# Patient Record
Sex: Male | Born: 1960
Health system: Southern US, Community
[De-identification: ages and names within clinical notes are randomized; demographics above are authoritative.]

## PROBLEM LIST (undated history)

## (undated) DIAGNOSIS — C4491 Basal cell carcinoma of skin, unspecified: Secondary | ICD-10-CM

## (undated) DIAGNOSIS — M109 Gout, unspecified: Secondary | ICD-10-CM

## (undated) DIAGNOSIS — N200 Calculus of kidney: Secondary | ICD-10-CM

## (undated) DIAGNOSIS — E119 Type 2 diabetes mellitus without complications: Secondary | ICD-10-CM

## (undated) DIAGNOSIS — E291 Testicular hypofunction: Secondary | ICD-10-CM

## (undated) DIAGNOSIS — K760 Fatty (change of) liver, not elsewhere classified: Secondary | ICD-10-CM

## (undated) DIAGNOSIS — E785 Hyperlipidemia, unspecified: Secondary | ICD-10-CM

## (undated) DIAGNOSIS — I10 Essential (primary) hypertension: Secondary | ICD-10-CM

## (undated) DIAGNOSIS — K5790 Diverticulosis of intestine, part unspecified, without perforation or abscess without bleeding: Secondary | ICD-10-CM

## (undated) DIAGNOSIS — K648 Other hemorrhoids: Secondary | ICD-10-CM

## (undated) DIAGNOSIS — J301 Allergic rhinitis due to pollen: Secondary | ICD-10-CM

## (undated) DIAGNOSIS — F1010 Alcohol abuse, uncomplicated: Secondary | ICD-10-CM

## (undated) HISTORY — DX: Type 2 diabetes mellitus without complications: E11.9

## (undated) HISTORY — DX: Other hemorrhoids: K64.8

## (undated) HISTORY — DX: Allergic rhinitis due to pollen: J30.1

## (undated) HISTORY — DX: Fatty (change of) liver, not elsewhere classified: K76.0

## (undated) HISTORY — DX: Basal cell carcinoma of skin, unspecified: C44.91

## (undated) HISTORY — DX: Testicular hypofunction: E29.1

## (undated) HISTORY — DX: Essential (primary) hypertension: I10

## (undated) HISTORY — PX: VASECTOMY: SHX75

## (undated) HISTORY — DX: Calculus of kidney: N20.0

## (undated) HISTORY — DX: Alcohol abuse, uncomplicated: F10.10

## (undated) HISTORY — DX: Diverticulosis of intestine, part unspecified, without perforation or abscess without bleeding: K57.90

## (undated) HISTORY — DX: Gout, unspecified: M10.9

## (undated) HISTORY — DX: Hyperlipidemia, unspecified: E78.5

---

## 2010-08-17 ENCOUNTER — Ambulatory Visit: Payer: Self-pay | Admitting: Dietician

## 2010-09-11 ENCOUNTER — Ambulatory Visit: Payer: Self-pay | Admitting: Dietician

## 2010-10-03 ENCOUNTER — Encounter: Payer: BC Managed Care – PPO | Attending: Family Medicine | Admitting: Dietician

## 2010-10-03 DIAGNOSIS — E119 Type 2 diabetes mellitus without complications: Secondary | ICD-10-CM | POA: Insufficient documentation

## 2010-10-03 DIAGNOSIS — Z713 Dietary counseling and surveillance: Secondary | ICD-10-CM | POA: Insufficient documentation

## 2010-10-15 ENCOUNTER — Encounter: Payer: Self-pay | Admitting: Dietician

## 2010-10-15 NOTE — Progress Notes (Signed)
  Medical Nutrition Therapy:  Appt start time: 0830 end time:  0930.   Assessment:  Primary concerns today: Diabetes self-management couseling.   MEDICATIONS: review of medications reveals he is no longer taking the insulin.  His fasting blood glucose levels are 97-113.   DIETARY INTAKE:  Usual eating pattern includes 3 meals and 2 snacks per day. 24-hr recall:  B (7-8:00 AM): Cheerios, 1 cup with a banana or blue berries and skim milk; or eggs with whole wheat toast and bacon. Snk ( AM): none  L ( PM): 12:00-1:00 Salad with lettuce, tomato carrots, black olives +/- cheese or sprouts and 1/2 of Hass avocado with low fat ranch dressing and +/- [place]. Snk ( PM): Special K Bar D ( PM): 6 inch flat bread sandwich from [employer] with grill chicken, cheese, avocado, and  Peppers. Snk ( PM): Usually has ice cream, has decreased his serving to 120 calories and adds a handful of almonds.  Usual physical activity: Walking 3 miles 5 times per week.  Estimated energy needs: 1800-1900 calories 200-205 g carbohydrates 135-140 g protein 50-52 g fat  Progress Towards Goal(s):  In progress.   Nutritional Diagnosis:  -2.1 Inpaired nutrition utilization As related to glucose.  As evidenced by diagnosis of type 2 diabetes and previously elevated blood glucose levesl.    Intervention:  Nutrition review of carbohydrate portions and exchanges and limiting his intake to 3-4 servings or 45-60 gm for meals and 15 gm of carbohydrate and protein for each snack.  Handouts given during visit include:  Novo Nordisk Carbohydrate Counting Book  Menu suggestions for 45 and 60 gram carbohydrate diets.  Snack list  Monitoring/Evaluation:  Dietary intake, exercise, blood glucose levels, and body weight in 12 weeks, to call with questions in the meantime or e-mail.  To call for follow-up visit.

## 2013-02-09 ENCOUNTER — Encounter: Payer: Self-pay | Admitting: Family Medicine

## 2013-02-09 ENCOUNTER — Ambulatory Visit (INDEPENDENT_AMBULATORY_CARE_PROVIDER_SITE_OTHER): Payer: BC Managed Care – PPO | Admitting: Family Medicine

## 2013-02-09 VITALS — BP 140/90 | HR 92 | Temp 97.4°F | Ht 64.25 in | Wt 214.2 lb

## 2013-02-09 DIAGNOSIS — C4491 Basal cell carcinoma of skin, unspecified: Secondary | ICD-10-CM

## 2013-02-09 DIAGNOSIS — I10 Essential (primary) hypertension: Secondary | ICD-10-CM

## 2013-02-09 DIAGNOSIS — J301 Allergic rhinitis due to pollen: Secondary | ICD-10-CM

## 2013-02-09 DIAGNOSIS — N200 Calculus of kidney: Secondary | ICD-10-CM

## 2013-02-09 DIAGNOSIS — E119 Type 2 diabetes mellitus without complications: Secondary | ICD-10-CM

## 2013-02-09 DIAGNOSIS — E291 Testicular hypofunction: Secondary | ICD-10-CM

## 2013-02-09 DIAGNOSIS — M109 Gout, unspecified: Secondary | ICD-10-CM

## 2013-02-09 MED ORDER — TESTOSTERONE 20.25 MG/1.25GM (1.62%) TD GEL: TRANSDERMAL | Status: DC

## 2013-02-09 NOTE — Progress Notes (Signed)
Date:  02/09/2013   Name:  Allen Howe   DOB:  10/06/1960   MRN:  [id number] Gender: male Age: 52 y.o.  Primary Physician:  Hannah Beat, MD   Chief Complaint: New Patient   Subjective:   History of Present Illness:  Allen Howe is a 52 y.o. pleasant patient who presents with the following:  Patient presents as a new patient. He is a former patient of Dr. Prince Rome.  He has type 2 diabetes mellitus, which is well controlled.  He also has hypertension, which is also well controlled, and he is very compliant.  He also does have intermittent gout, but he is taking some allopurinol as well as some intermittent colchicine and is stable.  He also has ALLERGIC rhinitis, but this is also stable when he takes all of his ALLERGY medication.  Hypergonadism, he has intermittently been on some different testosterone shots receiving them anywhere from every 2-4 weeks. He is unable to give his shots himself, and he previously would get them in either the pharmacy or at his primary care provider office. He recently weaned himself off of testosterone, and feels very poorly. He is interested in doing one of the topical medications and avoiding intramuscular injection, and he has a fear of needles.  Patient Active Problem List   Diagnosis Date Noted  . Hypertension   . Kidney stones   . Diabetes type 2, controlled   . Basal cell carcinoma   . Gout   . Allergic rhinitis due to pollen   . Hypogonadism male     Past Medical History  Diagnosis Date  . Diabetes type 2, controlled   . Basal cell carcinoma     Basal Cell on neck  . Kidney stones   . Hypertension   . Gout   . Allergic rhinitis due to pollen   . Hypogonadism male     No past surgical history on file.  History   Social History  . Marital Status: Married    Spouse Name: N/A    Number of Children: N/A  . Years of Education: N/A   Occupational History  . Not on file.   Social History Main Topics  . Smoking  status: Never Smoker   . Smokeless tobacco: Former Neurosurgeon    Types: Chew    Quit date: 10/14/2005  . Alcohol Use: 7.0 oz/week    14 drink(s) per week  . Drug Use: No  . Sexual Activity: Not on file   Other Topics Concern  . Not on file   Social History Narrative  . No narrative on file    Family History  Problem Relation Age of Onset  . Hypertension Mother   . Heart disease Father   . Hypertension Father   . Diabetes Father     No Known Allergies  Medication list has been reviewed and updated.  Review of Systems:   GEN: No acute illnesses, no fevers, chills. GI: No n/v/d, eating normally Pulm: No SOB Interactive and getting along well at home.  Otherwise, ROS is as per the HPI.  Objective:   Physical Examination: BP 140/90  Pulse 92  Temp(Src) 97.4 F (36.3 C) (Oral)  Ht 5' 4.25" (1.632 m)  Wt 214 lb 4 oz (97.183 kg)  BMI 36.49 kg/m2  Ideal Body Weight: Weight in (lb) to have BMI = 25: 146.5   GEN: WDWN, NAD, Non-toxic, A & O x 3 HEENT: Atraumatic, Normocephalic. Neck supple. No masses, No LAD. Ears and  Nose: No external deformity. CV: RRR, No M/G/R. No JVD. No thrill. No extra heart sounds. PULM: CTA B, no wheezes, crackles, rhonchi. No retractions. No resp. distress. No accessory muscle use. EXTR: No c/c/e NEURO Normal gait.  PSYCH: Normally interactive. Conversant. Not depressed or anxious appearing.  Calm demeanor.   Laboratory and Imaging Data: Reviewed data from Dr. Prince Rome chart  Assessment & Plan:    Hypogonadism male - Plan: Testosterone, free, total  Hypertension  Kidney stones  Diabetes type 2, controlled  Basal cell carcinoma  Gout  Allergic rhinitis due to pollen  Continue on current medications, we will start him on topical testosterone. Recheck a free and total testosterone one month after he begins treatment. Adjust dose accordingly if needed.  There are no Patient Instructions on file for this visit.  Orders Placed This  Encounter  Procedures  . Testosterone, free, total    New medications, updates to list, dose adjustments: Meds ordered this encounter  Medications  . fluticasone (FLONASE) 50 MCG/ACT nasal spray    Sig: Place 2 sprays into both nostrils daily.  . Vitamin D, Ergocalciferol, (DRISDOL) 50000 UNITS CAPS capsule    Sig: Take 50,000 Units by mouth every 7 (seven) days.  . colchicine 0.6 MG tablet    Sig: Take two tablets at onset on symptoms for gout  . Testosterone (ANDROGEL) 20.25 MG/1.25GM (1.62%) GEL    Sig: 2 pumps daily onto clean, dry shoulders (dispense 1 month supply)    Dispense:  1.25 g    Refill:  4    Signed,  Kristopher Delk T. Diannie Willner, MD, CAQ Sports Medicine  [facility] [address] [place] [address] Phone: [phone] Fax: [phone]    Medication List       This list is accurate as of: 02/09/13 11:59 PM.  Always use your most recent med list.               ALLEGRA 180 MG tablet  Generic drug:  fexofenadine  Take 180 mg by mouth daily.     allopurinol 100 MG tablet  Commonly known as:  ZYLOPRIM  Take 100 mg by mouth daily.     chlorthalidone 25 MG tablet  Commonly known as:  HYGROTON  Take 25 mg by mouth daily.     colchicine 0.6 MG tablet  Take two tablets at onset on symptoms for gout     DIOVAN 80 MG tablet  Generic drug:  valsartan  Take 80 mg by mouth daily.     fluticasone 50 MCG/ACT nasal spray  Commonly known as:  FLONASE  Place 2 sprays into both nostrils daily.     metFORMIN 500 MG tablet  Commonly known as:  GLUCOPHAGE  Take 500 mg by mouth 2 (two) times daily with a meal.     Testosterone 20.25 MG/1.25GM (1.62%) Gel  Commonly known as:  ANDROGEL  2 pumps daily onto clean, dry shoulders (dispense 1 month supply)     Vitamin D (Ergocalciferol) 50000 UNITS Caps capsule  Commonly known as:  DRISDOL  Take 50,000 Units by mouth every 7 (seven) days.

## 2013-02-09 NOTE — Progress Notes (Signed)
Pre-visit discussion using our clinic review tool. No additional management support is needed unless otherwise documented below in the visit note.  

## 2013-02-15 ENCOUNTER — Encounter: Payer: Self-pay | Admitting: Family Medicine

## 2013-02-15 DIAGNOSIS — IMO0001 Reserved for inherently not codable concepts without codable children: Secondary | ICD-10-CM | POA: Insufficient documentation

## 2013-02-15 DIAGNOSIS — J301 Allergic rhinitis due to pollen: Secondary | ICD-10-CM | POA: Insufficient documentation

## 2013-02-15 DIAGNOSIS — M109 Gout, unspecified: Secondary | ICD-10-CM | POA: Insufficient documentation

## 2013-02-15 DIAGNOSIS — I152 Hypertension secondary to endocrine disorders: Secondary | ICD-10-CM | POA: Insufficient documentation

## 2013-02-15 DIAGNOSIS — E291 Testicular hypofunction: Secondary | ICD-10-CM | POA: Insufficient documentation

## 2013-02-15 DIAGNOSIS — E119 Type 2 diabetes mellitus without complications: Secondary | ICD-10-CM | POA: Insufficient documentation

## 2013-02-15 DIAGNOSIS — C4491 Basal cell carcinoma of skin, unspecified: Secondary | ICD-10-CM | POA: Insufficient documentation

## 2013-02-15 DIAGNOSIS — N200 Calculus of kidney: Secondary | ICD-10-CM | POA: Insufficient documentation

## 2013-02-15 DIAGNOSIS — E1159 Type 2 diabetes mellitus with other circulatory complications: Secondary | ICD-10-CM | POA: Insufficient documentation

## 2013-02-15 DIAGNOSIS — E669 Obesity, unspecified: Secondary | ICD-10-CM | POA: Insufficient documentation

## 2013-02-25 LAB — HM DIABETES EYE EXAM: HM Diabetic Eye Exam: NORMAL

## 2013-03-01 ENCOUNTER — Encounter: Payer: Self-pay | Admitting: Family Medicine

## 2013-03-02 ENCOUNTER — Telehealth: Payer: Self-pay

## 2013-03-02 NOTE — Telephone Encounter (Signed)
Pt got letter from [employer] [facility] about coverage for androgel pump; advised pt letter from [employer] [facility] received with approval from 01/2013-02/2014. Pt will ck with pharmacy and if anything further needed pt or pharmacy will contact office.

## 2013-04-16 ENCOUNTER — Other Ambulatory Visit: Payer: Self-pay | Admitting: *Deleted

## 2013-04-16 NOTE — Telephone Encounter (Signed)
Last office visit 02/09/2013.  Ok to refill?

## 2013-04-18 MED ORDER — VITAMIN D (ERGOCALCIFEROL) 1.25 MG (50000 UNIT) PO CAPS: 50000.0000 [IU] | ORAL_CAPSULE | ORAL | Status: DC

## 2013-06-23 ENCOUNTER — Other Ambulatory Visit: Payer: Self-pay

## 2013-06-23 MED ORDER — METFORMIN HCL 500 MG PO TABS: 500.0000 mg | ORAL_TABLET | Freq: Two times a day (BID) | ORAL | Status: DC

## 2013-06-23 MED ORDER — FLUTICASONE PROPIONATE 50 MCG/ACT NA SUSP
2.0000 | Freq: Every day | NASAL | Status: DC
Start: 2013-06-23 — End: 2013-09-29

## 2013-06-23 NOTE — Telephone Encounter (Signed)
Pt has only seen Dr Lorelei Pont x 1; pt request refill flonase and metformin to [facility]; Dr Lorelei Pont has not filled before but meds are on pts med list. Pt had Metformin to take this morning but is out of med now; pt takes twice a day. Please advise.

## 2013-08-26 ENCOUNTER — Other Ambulatory Visit: Payer: Self-pay | Admitting: *Deleted

## 2013-08-26 MED ORDER — VALSARTAN 80 MG PO TABS: 80.0000 mg | ORAL_TABLET | Freq: Every day | ORAL | Status: DC

## 2013-08-27 ENCOUNTER — Telehealth: Payer: Self-pay | Admitting: Family Medicine

## 2013-08-27 NOTE — Telephone Encounter (Signed)
Diabetic Bundle.  Pt needs follow up appt for BP check, LDL, and A1C.  Left vm to return call.

## 2013-08-27 NOTE — Telephone Encounter (Signed)
Pt left v/m requesting status of valsartan refill; left v/m advising pt refill sent to [facility] on 08/26/13.

## 2013-09-24 NOTE — Telephone Encounter (Signed)
Diabetic Bundle.  Left another vm with information listed below requesting for pt to return call.

## 2013-09-29 ENCOUNTER — Telehealth: Payer: Self-pay | Admitting: Family Medicine

## 2013-09-29 ENCOUNTER — Ambulatory Visit (INDEPENDENT_AMBULATORY_CARE_PROVIDER_SITE_OTHER): Payer: No Typology Code available for payment source | Admitting: Family Medicine

## 2013-09-29 ENCOUNTER — Encounter: Payer: Self-pay | Admitting: Family Medicine

## 2013-09-29 VITALS — BP 162/94 | HR 95 | Temp 98.1°F | Ht 64.25 in | Wt 232.5 lb

## 2013-09-29 DIAGNOSIS — E119 Type 2 diabetes mellitus without complications: Secondary | ICD-10-CM | POA: Diagnosis not present

## 2013-09-29 DIAGNOSIS — R7989 Other specified abnormal findings of blood chemistry: Secondary | ICD-10-CM

## 2013-09-29 DIAGNOSIS — Z79899 Other long term (current) drug therapy: Secondary | ICD-10-CM

## 2013-09-29 DIAGNOSIS — E291 Testicular hypofunction: Secondary | ICD-10-CM

## 2013-09-29 DIAGNOSIS — E785 Hyperlipidemia, unspecified: Secondary | ICD-10-CM | POA: Diagnosis not present

## 2013-09-29 DIAGNOSIS — I10 Essential (primary) hypertension: Secondary | ICD-10-CM

## 2013-09-29 LAB — CBC WITH DIFFERENTIAL/PLATELET
Basophils Absolute: 0.1 10*3/uL (ref 0.0–0.1)
Basophils Relative: 0.8 % (ref 0.0–3.0)
EOS PCT: 3.1 % (ref 0.0–5.0)
Eosinophils Absolute: 0.2 10*3/uL (ref 0.0–0.7)
HCT: 40.4 % (ref 39.0–52.0)
Hemoglobin: 13.7 g/dL (ref 13.0–17.0)
Lymphocytes Relative: 23.2 % (ref 12.0–46.0)
Lymphs Abs: 1.7 10*3/uL (ref 0.7–4.0)
MCHC: 33.9 g/dL (ref 30.0–36.0)
MCV: 94.8 fl (ref 78.0–100.0)
Monocytes Absolute: 0.5 10*3/uL (ref 0.1–1.0)
Monocytes Relative: 7.6 % (ref 3.0–12.0)
NEUTROS PCT: 65.3 % (ref 43.0–77.0)
Neutro Abs: 4.7 10*3/uL (ref 1.4–7.7)
PLATELETS: 261 10*3/uL (ref 150.0–400.0)
RBC: 4.26 Mil/uL (ref 4.22–5.81)
RDW: 13 % (ref 11.5–15.5)
WBC: 7.2 10*3/uL (ref 4.0–10.5)

## 2013-09-29 LAB — LIPID PANEL
CHOL/HDL RATIO: 6
Cholesterol: 266 mg/dL — ABNORMAL HIGH (ref 0–200)
HDL: 42.2 mg/dL (ref >39.00)
NONHDL: 223.8
VLDL: 90.6 mg/dL — ABNORMAL HIGH (ref 0.0–40.0)

## 2013-09-29 LAB — HEPATIC FUNCTION PANEL
ALT: 74 U/L — ABNORMAL HIGH (ref 0–53)
AST: 58 U/L — ABNORMAL HIGH (ref 0–37)
Albumin: 4.2 g/dL (ref 3.5–5.2)
Alkaline Phosphatase: 50 U/L (ref 39–117)
BILIRUBIN DIRECT: 0.1 mg/dL (ref 0.0–0.3)
BILIRUBIN TOTAL: 0.8 mg/dL (ref 0.2–1.2)
Total Protein: 7.9 g/dL (ref 6.0–8.3)

## 2013-09-29 LAB — BASIC METABOLIC PANEL
BUN: 12 mg/dL (ref 6–23)
CALCIUM: 9.4 mg/dL (ref 8.4–10.5)
CO2: 29 mEq/L (ref 19–32)
CREATININE: 0.8 mg/dL (ref 0.4–1.5)
Chloride: 92 mEq/L — ABNORMAL LOW (ref 96–112)
GFR: 110.06 mL/min (ref >60.00)
Glucose, Bld: 159 mg/dL — ABNORMAL HIGH (ref 70–99)
Potassium: 4.2 mEq/L (ref 3.5–5.1)
Sodium: 134 mEq/L — ABNORMAL LOW (ref 135–145)

## 2013-09-29 LAB — MICROALBUMIN / CREATININE URINE RATIO
Creatinine,U: 129.9 mg/dL
Microalb Creat Ratio: 1.2 mg/g (ref 0.0–30.0)
Microalb, Ur: 1.5 mg/dL (ref 0.0–1.9)

## 2013-09-29 LAB — HEMOGLOBIN A1C: HEMOGLOBIN A1C: 7.7 % — AB (ref 4.6–6.5)

## 2013-09-29 LAB — LDL CHOLESTEROL, DIRECT: Direct LDL: 165.1 mg/dL

## 2013-09-29 MED ORDER — FLUTICASONE PROPIONATE 50 MCG/ACT NA SUSP: 2.0000 | Freq: Every day | NASAL | Status: DC

## 2013-09-29 MED ORDER — METFORMIN HCL 500 MG PO TABS: 500.0000 mg | ORAL_TABLET | Freq: Two times a day (BID) | ORAL | Status: DC

## 2013-09-29 MED ORDER — COLCHICINE 0.6 MG PO TABS
0.6000 mg | ORAL_TABLET | Freq: Every day | ORAL | Status: AC
Start: 2013-09-29 — End: ?

## 2013-09-29 MED ORDER — VALSARTAN 80 MG PO TABS: 80.0000 mg | ORAL_TABLET | Freq: Every day | ORAL | Status: DC

## 2013-09-29 MED ORDER — TESTOSTERONE 20.25 MG/1.25GM (1.62%) TD GEL: TRANSDERMAL | Status: DC

## 2013-09-29 MED ORDER — ALLOPURINOL 100 MG PO TABS: 100.0000 mg | ORAL_TABLET | Freq: Every day | ORAL | Status: DC

## 2013-09-29 MED ORDER — CHLORTHALIDONE 25 MG PO TABS: 25.0000 mg | ORAL_TABLET | Freq: Every day | ORAL | Status: DC

## 2013-09-29 NOTE — Progress Notes (Signed)
09/29/2013    ID: Loron Weimer   MRN: [id number]  DOB: 05/14/1960  Primary Physician:  Owens Loffler, MD  Chief Complaint: Follow-up  Subjective:   History of Present Illness:  Allen Howe is a 53 y.o. very pleasant male patient who presents with the following:  Working in a new job. Feels stressed out.   Eating really poorly.   BP up a lot.   HTN: Tolerating all medications without side effects. No meds x 1 week Stable and at goal No CP, no sob. No HA.  BP Readings from Last 3 Encounters:  09/29/13 162/94  02/09/13 140/90   Diabetes Mellitus: Tolerating Medications: yes Compliance with diet: POOR Exercise: minimal / intermittent Avg blood sugars at home: not checking Foot problems: none Hypoglycemia: none No nausea, vomitting, blurred vision, polyuria.  Wt Readings from Last 3 Encounters:  09/29/13 232 lb 8 oz (105.461 kg)  02/09/13 214 lb 4 oz (97.183 kg)  10/15/10 206 lb 4.8 oz (93.577 kg)    Body mass index is 39.6 kg/(m^2).   Hypogonad: tol androgel  Past Medical History, Surgical History, Social History, Family History, Problem List, Medications, and Allergies have been reviewed and updated if relevant.  Outpatient Encounter Prescriptions as of 09/29/2013  Medication Sig  . allopurinol (ZYLOPRIM) 100 MG tablet Take 100 mg by mouth daily.    . chlorthalidone (HYGROTON) 25 MG tablet Take 25 mg by mouth daily.    . colchicine 0.6 MG tablet Take two tablets at onset on symptoms for gout  . fexofenadine (ALLEGRA) 180 MG tablet Take 180 mg by mouth daily.    . fluticasone (FLONASE) 50 MCG/ACT nasal spray Place 2 sprays into both nostrils daily.  . metFORMIN (GLUCOPHAGE) 500 MG tablet Take 1 tablet (500 mg total) by mouth 2 (two) times daily with a meal.  . Testosterone (ANDROGEL) 20.25 MG/1.25GM (1.62%) GEL 2 pumps daily onto clean, dry shoulders (dispense 1 month supply)  . valsartan (DIOVAN) 80 MG tablet Take 1 tablet (80 mg total) by mouth daily.   . Vitamin D, Ergocalciferol, (DRISDOL) 50000 UNITS CAPS capsule Take 1 capsule (50,000 Units total) by mouth every 7 (seven) days.   Review of Systems:  GEN: No acute illnesses, no fevers, chills. GI: No n/v/d, eating normally Pulm: No SOB Interactive and getting along well at home.  Otherwise, ROS is as per the HPI.  Objective:   Physical Examination: BP 162/94  Pulse 95  Temp(Src) 98.1 F (36.7 C) (Oral)  Ht 5' 4.25" (1.632 m)  Wt 232 lb 8 oz (105.461 kg)  BMI 39.60 kg/m2  SpO2 98%   GEN: WDWN, NAD, Non-toxic, A & O x 3 HEENT: Atraumatic, Normocephalic. Neck supple. No masses, No LAD. Ears and Nose: No external deformity. CV: RRR, No M/G/R. No JVD. No thrill. No extra heart sounds. PULM: CTA B, no wheezes, crackles, rhonchi. No retractions. No resp. distress. No accessory muscle use. EXTR: No c/c/e NEURO Normal gait.  PSYCH: Normally interactive. Conversant. Not depressed or anxious appearing.  Calm demeanor.   Laboratory and Imaging Data:  Assessment & Plan:   Diabetes type 2, controlled - Plan: Basic metabolic panel, Lipid panel, Hemoglobin A1c, Microalbumin / creatinine urine ratio  Essential hypertension  Hypogonadism male - Plan: Testosterone, free, total  Encounter for long-term (current) use of other medications - Plan: CBC with Differential, Hepatic function panel  Refilled all, check labs.   New Prescriptions   No medications on file   Discontinued Medications  No medications on file   Modified Medications   Modified Medication Previous Medication   ALLOPURINOL (ZYLOPRIM) 100 MG TABLET allopurinol (ZYLOPRIM) 100 MG tablet      Take 1 tablet (100 mg total) by mouth daily.    Take 100 mg by mouth daily.     CHLORTHALIDONE (HYGROTON) 25 MG TABLET chlorthalidone (HYGROTON) 25 MG tablet      Take 1 tablet (25 mg total) by mouth daily.    Take 25 mg by mouth daily.     COLCHICINE 0.6 MG TABLET colchicine 0.6 MG tablet      Take 1 tablet (0.6 mg  total) by mouth daily. Take two tablets at onset on symptoms for gout    Take two tablets at onset on symptoms for gout   FLUTICASONE (FLONASE) 50 MCG/ACT NASAL SPRAY fluticasone (FLONASE) 50 MCG/ACT nasal spray      Place 2 sprays into both nostrils daily.    Place 2 sprays into both nostrils daily.   METFORMIN (GLUCOPHAGE) 500 MG TABLET metFORMIN (GLUCOPHAGE) 500 MG tablet      Take 1 tablet (500 mg total) by mouth 2 (two) times daily with a meal.    Take 1 tablet (500 mg total) by mouth 2 (two) times daily with a meal.   TESTOSTERONE (ANDROGEL) 20.25 MG/1.25GM (1.62%) GEL Testosterone (ANDROGEL) 20.25 MG/1.25GM (1.62%) GEL      2 pumps daily onto clean, dry shoulders (dispense 1 month supply)    2 pumps daily onto clean, dry shoulders (dispense 1 month supply)   VALSARTAN (DIOVAN) 80 MG TABLET valsartan (DIOVAN) 80 MG tablet      Take 1 tablet (80 mg total) by mouth daily.    Take 1 tablet (80 mg total) by mouth daily.   Orders Placed This Encounter  Procedures  . Basic metabolic panel  . CBC with Differential  . Hepatic function panel  . Lipid panel  . Hemoglobin A1c  . Microalbumin / creatinine urine ratio  . Testosterone, free, total   Follow-up: Return in about 6 months (around 04/01/2014). Unless noted above, the patient is to follow-up if symptoms worsen. Red flags were reviewed with the patient.  Signed,  Maud Deed. Garvey Westcott, MD, [facility] [place], [address] Phone: [phone] Fax: [phone]

## 2013-09-29 NOTE — Progress Notes (Signed)
Pre visit review using our clinic review tool, if applicable. No additional management support is needed unless otherwise documented below in the visit note. 

## 2013-09-29 NOTE — Telephone Encounter (Signed)
Relevant patient education assigned to patient using Emmi. ° °

## 2013-09-30 LAB — TESTOSTERONE, FREE, TOTAL, SHBG
Sex Hormone Binding: 13 nmol/L (ref 13–71)
TESTOSTERONE-% FREE: 3 % — AB (ref 1.6–2.9)
Testosterone, Free: 84.1 pg/mL (ref 47.0–244.0)
Testosterone: 282 ng/dL — ABNORMAL LOW (ref 300–890)

## 2013-10-07 ENCOUNTER — Telehealth: Payer: Self-pay | Admitting: *Deleted

## 2013-10-07 NOTE — Telephone Encounter (Signed)
Received prior authorization request from [place] for Androgel.  Forms requested, completed and faxed back to [facility] at [phone].

## 2013-10-11 ENCOUNTER — Other Ambulatory Visit: Payer: Self-pay | Admitting: Family Medicine

## 2013-10-11 DIAGNOSIS — E785 Hyperlipidemia, unspecified: Secondary | ICD-10-CM

## 2013-10-11 DIAGNOSIS — R7401 Elevation of levels of liver transaminase levels: Secondary | ICD-10-CM

## 2013-10-11 DIAGNOSIS — E119 Type 2 diabetes mellitus without complications: Secondary | ICD-10-CM

## 2013-10-11 DIAGNOSIS — R740 Nonspecific elevation of levels of transaminase and lactic acid dehydrogenase [LDH]: Secondary | ICD-10-CM

## 2013-10-11 NOTE — Telephone Encounter (Signed)
Androgel approved effective 10/08/2013 thru 01/08/2014.

## 2013-10-27 ENCOUNTER — Other Ambulatory Visit: Payer: Self-pay | Admitting: Family Medicine

## 2013-10-27 NOTE — Telephone Encounter (Signed)
Last office visit 09/29/2013.  Last refilled 04/18/2013 for #12 with 1 refill.  Ok to refill?

## 2013-12-31 ENCOUNTER — Other Ambulatory Visit (INDEPENDENT_AMBULATORY_CARE_PROVIDER_SITE_OTHER): Payer: No Typology Code available for payment source

## 2013-12-31 DIAGNOSIS — R7401 Elevation of levels of liver transaminase levels: Secondary | ICD-10-CM

## 2013-12-31 DIAGNOSIS — E119 Type 2 diabetes mellitus without complications: Secondary | ICD-10-CM

## 2013-12-31 DIAGNOSIS — R740 Nonspecific elevation of levels of transaminase and lactic acid dehydrogenase [LDH]: Secondary | ICD-10-CM

## 2013-12-31 DIAGNOSIS — E785 Hyperlipidemia, unspecified: Secondary | ICD-10-CM

## 2013-12-31 LAB — HEPATIC FUNCTION PANEL
ALT: 79 U/L — ABNORMAL HIGH (ref 0–53)
AST: 56 U/L — ABNORMAL HIGH (ref 0–37)
Albumin: 3.6 g/dL (ref 3.5–5.2)
Alkaline Phosphatase: 53 U/L (ref 39–117)
BILIRUBIN DIRECT: 0 mg/dL (ref 0.0–0.3)
Total Bilirubin: 0.8 mg/dL (ref 0.2–1.2)
Total Protein: 7.8 g/dL (ref 6.0–8.3)

## 2013-12-31 LAB — LIPID PANEL
CHOL/HDL RATIO: 7
CHOLESTEROL: 242 mg/dL — AB (ref 0–200)
HDL: 35.2 mg/dL — ABNORMAL LOW (ref >39.00)
NonHDL: 206.8
TRIGLYCERIDES: 302 mg/dL — AB (ref 0.0–149.0)
VLDL: 60.4 mg/dL — ABNORMAL HIGH (ref 0.0–40.0)

## 2013-12-31 LAB — BASIC METABOLIC PANEL
BUN: 15 mg/dL (ref 6–23)
CALCIUM: 9.2 mg/dL (ref 8.4–10.5)
CO2: 28 mEq/L (ref 19–32)
Chloride: 96 mEq/L (ref 96–112)
Creatinine, Ser: 0.8 mg/dL (ref 0.4–1.5)
GFR: 113.3 mL/min (ref >60.00)
Glucose, Bld: 187 mg/dL — ABNORMAL HIGH (ref 70–99)
Potassium: 3.9 mEq/L (ref 3.5–5.1)
SODIUM: 134 meq/L — AB (ref 135–145)

## 2013-12-31 LAB — LDL CHOLESTEROL, DIRECT: Direct LDL: 163.5 mg/dL

## 2013-12-31 LAB — HEMOGLOBIN A1C: Hgb A1c MFr Bld: 8.4 % — ABNORMAL HIGH (ref 4.6–6.5)

## 2014-01-01 LAB — HEPATITIS PANEL, ACUTE
HCV Ab: NEGATIVE
HEP B C IGM: NONREACTIVE
Hep A IgM: NONREACTIVE
Hepatitis B Surface Ag: NEGATIVE

## 2014-01-05 ENCOUNTER — Ambulatory Visit (INDEPENDENT_AMBULATORY_CARE_PROVIDER_SITE_OTHER): Payer: No Typology Code available for payment source | Admitting: Family Medicine

## 2014-01-05 ENCOUNTER — Encounter: Payer: Self-pay | Admitting: Family Medicine

## 2014-01-05 VITALS — BP 132/88 | HR 120 | Temp 97.8°F | Ht 64.25 in | Wt 228.0 lb

## 2014-01-05 DIAGNOSIS — E785 Hyperlipidemia, unspecified: Secondary | ICD-10-CM

## 2014-01-05 DIAGNOSIS — E291 Testicular hypofunction: Secondary | ICD-10-CM

## 2014-01-05 DIAGNOSIS — E119 Type 2 diabetes mellitus without complications: Secondary | ICD-10-CM

## 2014-01-05 DIAGNOSIS — Z23 Encounter for immunization: Secondary | ICD-10-CM

## 2014-01-05 DIAGNOSIS — E669 Obesity, unspecified: Secondary | ICD-10-CM

## 2014-01-05 DIAGNOSIS — I10 Essential (primary) hypertension: Secondary | ICD-10-CM

## 2014-01-05 MED ORDER — ATORVASTATIN CALCIUM 20 MG PO TABS: 20.0000 mg | ORAL_TABLET | Freq: Every day | ORAL | Status: DC

## 2014-01-05 NOTE — Progress Notes (Signed)
Dr. Frederico Hamman T. Tequlia Gonsalves, MD, [facility] [place], [address] Phone: [phone] Fax: [phone]  01/05/2014  Patient: Allen Howe, MRN: [id number], DOB: [birth year], 53 y.o.  Primary Physician:  Owens Loffler, MD  Chief Complaint: Follow-up  Subjective:   Allen Howe is a 53 y.o. very pleasant male patient who presents with the following:  DM: got lost a little bit. Has not really started working out. Working all the time. Quit drinking beer.   Diabetes Mellitus: Tolerating Medications: yes Compliance with diet: fair to poor Exercise: minimal / intermittent Avg blood sugars at home: not checking Foot problems: none Hypoglycemia: none No nausea, vomitting, blurred vision, polyuria.  Lab Results  Component Value Date   HGBA1C 8.4* 12/31/2013   HGBA1C 7.7* 09/29/2013   Lab Results  Component Value Date   MICROALBUR 1.5 09/29/2013   CREATININE 0.8 12/31/2013    Wt Readings from Last 3 Encounters:  01/05/14 228 lb (103.42 kg)  09/29/13 232 lb 8 oz (105.461 kg)  02/09/13 214 lb 4 oz (97.183 kg)    Body mass index is 38.83 kg/(m^2).   HTN: Tolerating all medications without side effects Stable and at goal No CP, no sob. No HA.  BP Readings from Last 3 Encounters:  01/05/14 132/88  09/29/13 162/94  02/09/13 599/35    Basic Metabolic Panel:    Component Value Date/Time   NA 134* 12/31/2013 0813   K 3.9 12/31/2013 0813   CL 96 12/31/2013 0813   CO2 28 12/31/2013 0813   BUN 15 12/31/2013 0813   CREATININE 0.8 12/31/2013 0813   GLUCOSE 187* 12/31/2013 0813   CALCIUM 9.2 12/31/2013 0813      Past Medical History, Surgical History, Social History, Family History, Problem List, Medications, and Allergies have been reviewed and updated if relevant.   GEN: No acute illnesses, no fevers, chills. GI: No n/v/d, eating normally Pulm: No SOB Interactive and getting along well at  home.  Otherwise, ROS is as per the HPI.  Objective:   BP 132/88 mmHg  Pulse 120  Temp(Src) 97.8 F (36.6 C) (Oral)  Ht 5' 4.25" (1.632 m)  Wt 228 lb (103.42 kg)  BMI 38.83 kg/m2  SpO2 97%  GEN: WDWN, NAD, Non-toxic, A & O x 3 HEENT: Atraumatic, Normocephalic. Neck supple. No masses, No LAD. Ears and Nose: No external deformity. CV: RRR, No M/G/R. No JVD. No thrill. No extra heart sounds. PULM: CTA B, no wheezes, crackles, rhonchi. No retractions. No resp. distress. No accessory muscle use. EXTR: No c/c/e NEURO Normal gait.  PSYCH: Normally interactive. Conversant. Not depressed or anxious appearing.  Calm demeanor.   Laboratory and Imaging Data: Results for orders placed or performed in visit on 12/31/13  Hepatic function panel  Result Value Ref Range   Total Bilirubin 0.8 0.2 - 1.2 mg/dL   Bilirubin, Direct 0.0 0.0 - 0.3 mg/dL   Alkaline Phosphatase 53 39 - 117 U/L   AST 56 (H) 0 - 37 U/L   ALT 79 (H) 0 - 53 U/L   Total Protein 7.8 6.0 - 8.3 g/dL   Albumin 3.6 3.5 - 5.2 g/dL  Basic metabolic panel  Result Value Ref Range   Sodium 134 (L) 135 - 145 mEq/L   Potassium 3.9 3.5 - 5.1 mEq/L   Chloride 96 96 - 112 mEq/L   CO2 28 19 - 32 mEq/L   Glucose, Bld 187 (H) 70 - 99 mg/dL  BUN 15 6 - 23 mg/dL   Creatinine, Ser 0.8 0.4 - 1.5 mg/dL   Calcium 9.2 8.4 - 10.5 mg/dL   GFR 113.30 >60.00 mL/min  Hemoglobin A1c  Result Value Ref Range   Hgb A1c MFr Bld 8.4 (H) 4.6 - 6.5 %  Lipid panel  Result Value Ref Range   Cholesterol 242 (H) 0 - 200 mg/dL   Triglycerides 302.0 (H) 0.0 - 149.0 mg/dL   HDL 35.20 (L) >39.00 mg/dL   VLDL 60.4 (H) 0.0 - 40.0 mg/dL   Total CHOL/HDL Ratio 7    NonHDL 206.80   Hepatitis panel, acute  Result Value Ref Range   Hepatitis B Surface Ag NEGATIVE NEGATIVE   HCV Ab NEGATIVE NEGATIVE   Hep B C IgM NON REACTIVE NON REACTIVE   Hep A IgM NON REACTIVE NON REACTIVE  LDL cholesterol, direct  Result Value Ref Range   Direct LDL 163.5 mg/dL      Assessment and Plan:   Diabetes type 2, controlled  Essential hypertension  Hypogonadism male  Need for prophylactic vaccination and inoculation against influenza - Plan: Flu Vaccine QUAD 36+ mos PF IM (Fluarix Quad PF)  Obesity (BMI 30-39.9)  Hyperlipidemia LDL goal <70  New diagnosis, hyperlipidemia.  Start the patient on statin medication.  Reviewed current recommendations.  For now start Lipitor 20 mg to see if the patient can tolerate this and recheck at his physical examination.  Diabetes is actually doing worse.  The patient has been noncompliant with his diet at all per his report, but he has cut back on his alcohol intake.  He thinks that he could do dramatically better with this, and he like to lose 20 or 30 pounds prior to his physical.  I think it is reasonable to do a trial of this prior to increasing his current medication.  We also had a prolonged talk about diet, exercise, and obesity.  Follow-up: CPX  New Prescriptions   ATORVASTATIN (LIPITOR) 20 MG TABLET    Take 1 tablet (20 mg total) by mouth daily.   Orders Placed This Encounter  Procedures  . Flu Vaccine QUAD 36+ mos PF IM (Fluarix Quad PF)    Signed,  Chloe Baig T. Raseel Jans, MD   Patient's Medications  New Prescriptions   ATORVASTATIN (LIPITOR) 20 MG TABLET    Take 1 tablet (20 mg total) by mouth daily.  Previous Medications   ALLOPURINOL (ZYLOPRIM) 100 MG TABLET    Take 1 tablet (100 mg total) by mouth daily.   CHLORTHALIDONE (HYGROTON) 25 MG TABLET    Take 1 tablet (25 mg total) by mouth daily.   COLCHICINE 0.6 MG TABLET    Take 1 tablet (0.6 mg total) by mouth daily. Take two tablets at onset on symptoms for gout   FEXOFENADINE (ALLEGRA) 180 MG TABLET    Take 180 mg by mouth daily.     FLUTICASONE (FLONASE) 50 MCG/ACT NASAL SPRAY    Place 2 sprays into both nostrils daily.   METFORMIN (GLUCOPHAGE) 500 MG TABLET    Take 1 tablet (500 mg total) by mouth 2 (two) times daily with a meal.    TESTOSTERONE (ANDROGEL) 20.25 MG/1.25GM (1.62%) GEL    2 pumps daily onto clean, dry shoulders (dispense 1 month supply)   VALSARTAN (DIOVAN) 80 MG TABLET    Take 1 tablet (80 mg total) by mouth daily.   VITAMIN D, ERGOCALCIFEROL, (DRISDOL) 50000 UNITS CAPS CAPSULE    TAKE 1 CAPSULE BY MOUTH EVERY 7  DAYS.  Modified Medications   No medications on file  Discontinued Medications   No medications on file

## 2014-01-05 NOTE — Progress Notes (Signed)
Pre visit review using our clinic review tool, if applicable. No additional management support is needed unless otherwise documented below in the visit note. 

## 2014-01-06 ENCOUNTER — Encounter: Payer: Self-pay | Admitting: Family Medicine

## 2014-01-06 DIAGNOSIS — E785 Hyperlipidemia, unspecified: Secondary | ICD-10-CM

## 2014-01-06 DIAGNOSIS — E1169 Type 2 diabetes mellitus with other specified complication: Secondary | ICD-10-CM | POA: Insufficient documentation

## 2014-01-06 HISTORY — DX: Hyperlipidemia, unspecified: E78.5

## 2014-05-26 ENCOUNTER — Other Ambulatory Visit: Payer: Self-pay | Admitting: Family Medicine

## 2014-05-27 ENCOUNTER — Telehealth: Payer: Self-pay | Admitting: *Deleted

## 2014-05-27 NOTE — Telephone Encounter (Signed)
Last office visit 01/05/2014.  Androgel last refilled 09/29/2013 for 1.25 g with 5 refills.  Vit D 10/27/2013 for #12 with 1 refill.  Ok to refill?

## 2014-05-27 NOTE — Telephone Encounter (Signed)
Androgel called to [facility].

## 2014-05-27 NOTE — Telephone Encounter (Signed)
Received fax from [facility] requesting PA for Androgel.  PA completed on CoverMyMeds and labs supporting PA faxed to [facility] as requested.

## 2014-05-27 NOTE — Telephone Encounter (Signed)
Vit d, 12, 1 ref

## 2014-05-27 NOTE — Telephone Encounter (Signed)
Please call and schedule CPE with fasting labs prior with Dr. Lorelei Pont sometime within the next 2 months.   Needs appointment in order to keep getting his medications refilled.

## 2014-05-27 NOTE — Telephone Encounter (Signed)
What about the Vit D?  Refill?

## 2014-05-27 NOTE — Telephone Encounter (Signed)
Patient has diabetes, high chol, and is on testosterone. Was supposed to f/u for CPX. None in good control. Remind him that all people on testosterone need monitoring every 6 months.  Ok to refill 1 month supply with 2 refills, on androgel, CPX within 2 mo.  Ideally labs before, if not possible, CPX needs to be between 8-9 AM to accurately check test levels.

## 2014-06-01 NOTE — Telephone Encounter (Signed)
Pt will call back to R/S appt. He is aware that if he doesn't schedule CPE he will not keep getting his medications refilled. 4/13-AS

## 2014-09-27 ENCOUNTER — Other Ambulatory Visit: Payer: Self-pay | Admitting: Family Medicine

## 2014-09-28 ENCOUNTER — Other Ambulatory Visit: Payer: Self-pay | Admitting: *Deleted

## 2014-09-28 MED ORDER — TESTOSTERONE 20.25 MG/ACT (1.62%) TD GEL: TRANSDERMAL | Status: DC

## 2014-09-28 NOTE — Telephone Encounter (Signed)
Ok, 75 g, 2 refills

## 2014-09-28 NOTE — Telephone Encounter (Signed)
Rx called in as directed.   

## 2014-09-28 NOTE — Telephone Encounter (Signed)
Ok to refill 

## 2014-10-27 ENCOUNTER — Other Ambulatory Visit: Payer: Self-pay | Admitting: Family Medicine

## 2014-10-27 NOTE — Telephone Encounter (Signed)
Last office visit 01/05/2014.  No future appointment scheduled.  Ok to refill?

## 2014-10-27 NOTE — Telephone Encounter (Signed)
Last office visit 01/05/2014.  No future appointments scheduled.  Ok to refill?

## 2014-10-28 ENCOUNTER — Telehealth: Payer: Self-pay | Admitting: Family Medicine

## 2014-10-28 NOTE — Telephone Encounter (Signed)
No, he doesn't need to come in earlier. We will refill his medication to get him to his appointment.

## 2014-10-28 NOTE — Telephone Encounter (Signed)
Pt called to schedule cpx 10/27 and labs 10/24.  He wanted to know if he needed to come in sooner for labs  He had pharmacy send a refill request

## 2014-10-28 NOTE — Telephone Encounter (Signed)
30, 2 refills with f/u CPX

## 2014-10-28 NOTE — Telephone Encounter (Signed)
Ok to refill 60, 2 refills  F/u CPX

## 2014-10-28 NOTE — Telephone Encounter (Signed)
Pt aware.

## 2014-12-12 ENCOUNTER — Other Ambulatory Visit (INDEPENDENT_AMBULATORY_CARE_PROVIDER_SITE_OTHER): Payer: BLUE CROSS/BLUE SHIELD

## 2014-12-12 ENCOUNTER — Other Ambulatory Visit: Payer: Self-pay | Admitting: Family Medicine

## 2014-12-12 DIAGNOSIS — E291 Testicular hypofunction: Secondary | ICD-10-CM

## 2014-12-12 DIAGNOSIS — E785 Hyperlipidemia, unspecified: Secondary | ICD-10-CM

## 2014-12-12 DIAGNOSIS — Z79899 Other long term (current) drug therapy: Secondary | ICD-10-CM

## 2014-12-12 DIAGNOSIS — Z125 Encounter for screening for malignant neoplasm of prostate: Secondary | ICD-10-CM

## 2014-12-12 DIAGNOSIS — E119 Type 2 diabetes mellitus without complications: Secondary | ICD-10-CM

## 2014-12-12 LAB — MICROALBUMIN / CREATININE URINE RATIO
CREATININE, U: 186.1 mg/dL
MICROALB UR: 1.8 mg/dL (ref 0.0–1.9)
Microalb Creat Ratio: 1 mg/g (ref 0.0–30.0)

## 2014-12-12 LAB — HEPATIC FUNCTION PANEL
ALBUMIN: 4.3 g/dL (ref 3.5–5.2)
ALK PHOS: 59 U/L (ref 39–117)
ALT: 38 U/L (ref 0–53)
AST: 19 U/L (ref 0–37)
BILIRUBIN DIRECT: 0.1 mg/dL (ref 0.0–0.3)
TOTAL PROTEIN: 7.1 g/dL (ref 6.0–8.3)
Total Bilirubin: 0.5 mg/dL (ref 0.2–1.2)

## 2014-12-12 LAB — CBC WITH DIFFERENTIAL/PLATELET
BASOS ABS: 0 10*3/uL (ref 0.0–0.1)
Basophils Relative: 0.6 % (ref 0.0–3.0)
Eosinophils Absolute: 0.3 10*3/uL (ref 0.0–0.7)
Eosinophils Relative: 3.9 % (ref 0.0–5.0)
HEMATOCRIT: 38.9 % — AB (ref 39.0–52.0)
HEMOGLOBIN: 13.1 g/dL (ref 13.0–17.0)
LYMPHS PCT: 27.2 % (ref 12.0–46.0)
Lymphs Abs: 2.2 10*3/uL (ref 0.7–4.0)
MCHC: 33.6 g/dL (ref 30.0–36.0)
MCV: 91.6 fl (ref 78.0–100.0)
MONOS PCT: 6.9 % (ref 3.0–12.0)
Monocytes Absolute: 0.6 10*3/uL (ref 0.1–1.0)
NEUTROS ABS: 5 10*3/uL (ref 1.4–7.7)
Neutrophils Relative %: 61.4 % (ref 43.0–77.0)
PLATELETS: 254 10*3/uL (ref 150.0–400.0)
RBC: 4.25 Mil/uL (ref 4.22–5.81)
RDW: 13.3 % (ref 11.5–15.5)
WBC: 8.2 10*3/uL (ref 4.0–10.5)

## 2014-12-12 LAB — BASIC METABOLIC PANEL
BUN: 16 mg/dL (ref 6–23)
CALCIUM: 10.1 mg/dL (ref 8.4–10.5)
CO2: 32 mEq/L (ref 19–32)
CREATININE: 0.77 mg/dL (ref 0.40–1.50)
Chloride: 94 mEq/L — ABNORMAL LOW (ref 96–112)
GFR: 111.22 mL/min (ref >60.00)
Glucose, Bld: 197 mg/dL — ABNORMAL HIGH (ref 70–99)
Potassium: 4.3 mEq/L (ref 3.5–5.1)
Sodium: 137 mEq/L (ref 135–145)

## 2014-12-12 LAB — LIPID PANEL
CHOL/HDL RATIO: 4
CHOLESTEROL: 162 mg/dL (ref 0–200)
HDL: 45.3 mg/dL (ref >39.00)
NonHDL: 117.02
TRIGLYCERIDES: 259 mg/dL — AB (ref 0.0–149.0)
VLDL: 51.8 mg/dL — ABNORMAL HIGH (ref 0.0–40.0)

## 2014-12-12 LAB — HEMOGLOBIN A1C: Hgb A1c MFr Bld: 9.3 % — ABNORMAL HIGH (ref 4.6–6.5)

## 2014-12-12 LAB — PSA: PSA: 0.95 ng/mL (ref 0.10–4.00)

## 2014-12-12 LAB — LDL CHOLESTEROL, DIRECT: LDL DIRECT: 91 mg/dL

## 2014-12-13 LAB — TESTOSTERONE, FREE, TOTAL, SHBG
Sex Hormone Binding: 14 nmol/L (ref 10–50)
TESTOSTERONE-% FREE: 2.8 % (ref 1.6–2.9)
TESTOSTERONE: 198 ng/dL — AB (ref 300–890)
Testosterone, Free: 56.3 pg/mL (ref 47.0–244.0)

## 2014-12-15 ENCOUNTER — Ambulatory Visit (INDEPENDENT_AMBULATORY_CARE_PROVIDER_SITE_OTHER): Payer: BLUE CROSS/BLUE SHIELD | Admitting: Family Medicine

## 2014-12-15 ENCOUNTER — Encounter: Payer: Self-pay | Admitting: Family Medicine

## 2014-12-15 VITALS — BP 140/92 | HR 98 | Temp 97.6°F | Ht 64.5 in | Wt 224.2 lb

## 2014-12-15 DIAGNOSIS — E785 Hyperlipidemia, unspecified: Secondary | ICD-10-CM

## 2014-12-15 DIAGNOSIS — I10 Essential (primary) hypertension: Secondary | ICD-10-CM | POA: Diagnosis not present

## 2014-12-15 DIAGNOSIS — Z23 Encounter for immunization: Secondary | ICD-10-CM

## 2014-12-15 DIAGNOSIS — E291 Testicular hypofunction: Secondary | ICD-10-CM | POA: Diagnosis not present

## 2014-12-15 DIAGNOSIS — IMO0001 Reserved for inherently not codable concepts without codable children: Secondary | ICD-10-CM

## 2014-12-15 DIAGNOSIS — E1165 Type 2 diabetes mellitus with hyperglycemia: Secondary | ICD-10-CM

## 2014-12-15 DIAGNOSIS — Z0000 Encounter for general adult medical examination without abnormal findings: Secondary | ICD-10-CM | POA: Diagnosis not present

## 2014-12-15 DIAGNOSIS — E669 Obesity, unspecified: Secondary | ICD-10-CM

## 2014-12-15 MED ORDER — NONFORMULARY OR COMPOUNDED ITEM: Status: DC

## 2014-12-15 MED ORDER — METFORMIN HCL 500 MG PO TABS: 1000.0000 mg | ORAL_TABLET | Freq: Two times a day (BID) | ORAL | Status: DC

## 2014-12-15 NOTE — Progress Notes (Signed)
Pre visit review using our clinic review tool, if applicable. No additional management support is needed unless otherwise documented below in the visit note. 

## 2014-12-15 NOTE — Progress Notes (Signed)
Dr. Frederico Hamman T. Alford Gamero, MD, [facility] [place], [address] Phone: [phone] Fax: [phone]  12/15/2014  Patient: Allen Howe, MRN: [id number], DOB: October 25, 1960, 54 y.o.  Primary Physician:  Owens Loffler, MD   Chief Complaint  Patient presents with  . Annual Exam   Subjective:   Allen Howe is a 54 y.o. pleasant patient who presents with the following:  Preventative Health Maintenance Visit and chronic disease management.  Health Maintenance Summary Reviewed and updated, unless pt declines services.  Tobacco History Reviewed. Alcohol: 1-4 most days.  Exercise Habits: Some activity, rec at least 30 mins 5 times a week STD concerns: no risk or activity to increase risk Drug Use: None Encouraged self-testicular check  Health Maintenance  Topic Date Due  . HIV Screening  04/17/1974  . COLONOSCOPY  04/17/2009  . HEMOGLOBIN A1C  06/12/2015  . INFLUENZA VACCINE  09/19/2015  . URINE MICROALBUMIN  12/12/2015  . FOOT EXAM  12/17/2015  . OPHTHALMOLOGY EXAM  12/17/2015  . PNEUMOCOCCAL POLYSACCHARIDE VACCINE (2) 12/15/2019  . TETANUS/TDAP  12/14/2024  . Hepatitis C Screening  Completed   Immunization History  Administered Date(s) Administered  . Influenza,inj,Quad PF,36+ Mos 01/05/2014, 12/15/2014  . Pneumococcal Polysaccharide-23 12/15/2014  . Tdap 12/15/2014   Going through a divorce  Has son Suezanne Jacquet every other week. Emotional today in the office  Diabetes Mellitus: Tolerating Medications: yes Compliance with diet: fair Exercise: minimal / intermittent Avg blood sugars at home: not checking Foot problems: none Hypoglycemia: none No nausea, vomitting, blurred vision, polyuria.  Lab Results  Component Value Date   HGBA1C 9.3* 12/12/2014   HGBA1C 8.4* 12/31/2013   HGBA1C 7.7* 09/29/2013   Lab Results  Component Value Date   MICROALBUR 1.8 12/12/2014   CREATININE 0.77 12/12/2014    Wt  Readings from Last 3 Encounters:  12/15/14 224 lb 4 oz (101.719 kg)  01/05/14 228 lb (103.42 kg)  09/29/13 232 lb 8 oz (105.461 kg)    Body mass index is 37.91 kg/(m^2).   Lipids: Doing well, stable. Tolerating meds fine with no SE. Panel reviewed with patient.  Lipids:    Component Value Date/Time   CHOL 162 12/12/2014 0830   TRIG 259.0* 12/12/2014 0830   HDL 45.30 12/12/2014 0830   LDLDIRECT 91.0 12/12/2014 0830   VLDL 51.8* 12/12/2014 0830   CHOLHDL 4 12/12/2014 0830    Lab Results  Component Value Date   ALT 38 12/12/2014   AST 19 12/12/2014   ALKPHOS 59 12/12/2014   BILITOT 0.5 12/12/2014    HTN: Tolerating all medications without side effects Stable and at goal No CP, no sob. No HA.  BP Readings from Last 3 Encounters:  12/15/14 140/92  01/05/14 132/88  09/29/13 546/50    Basic Metabolic Panel:    Component Value Date/Time   NA 137 12/12/2014 0830   K 4.3 12/12/2014 0830   CL 94* 12/12/2014 0830   CO2 32 12/12/2014 0830   BUN 16 12/12/2014 0830   CREATININE 0.77 12/12/2014 0830   GLUCOSE 197* 12/12/2014 0830   CALCIUM 10.1 12/12/2014 0830      Patient Active Problem List   Diagnosis Date Noted  . Diabetes mellitus type 2, uncontrolled, without complications (HCC)     Priority: High  . Hyperlipidemia LDL goal <70 01/06/2014  . Obesity (BMI 30-39.9) 02/15/2013  . Hypertension   . Kidney stones   . Basal cell carcinoma   . Gout   .  Allergic rhinitis due to pollen   . Hypogonadism male    Past Medical History  Diagnosis Date  . Diabetes type 2, controlled ([facility])   . Basal cell carcinoma     Basal Cell on neck  . Kidney stones   . Hypertension   . Gout   . Allergic rhinitis due to pollen   . Hypogonadism male   . Hyperlipidemia LDL goal <70 01/06/2014   No past surgical history on file. Social History   Social History  . Marital Status: Married    Spouse Name: N/A  . Number of Children: N/A  . Years of Education: N/A    Occupational History  . Not on file.   Social History Main Topics  . Smoking status: Never Smoker   . Smokeless tobacco: Former Systems developer    Types: Chew    Quit date: 10/14/2005  . Alcohol Use: 8.4 oz/week    14 Standard drinks or equivalent per week     Comment: occ  . Drug Use: No  . Sexual Activity: Not on file   Other Topics Concern  . Not on file   Social History Narrative   Family History  Problem Relation Age of Onset  . Hypertension Mother   . Heart disease Father   . Hypertension Father   . Diabetes Father    No Known Allergies  Medication list has been reviewed and updated.   General: Denies fever, chills, sweats. No significant weight loss. Eyes: Denies blurring,significant itching ENT: Denies earache, sore throat, and hoarseness. Cardiovascular: Denies chest pains, palpitations, dyspnea on exertion Respiratory: Denies cough, dyspnea at rest,wheeezing Breast: no concerns about lumps GI: Denies nausea, vomiting, diarrhea, constipation, change in bowel habits, abdominal pain, melena, hematochezia GU: Denies penile discharge, ED, urinary flow / outflow problems. No STD concerns. Musculoskeletal: Denies back pain, joint pain Derm: Denies rash, itching Neuro: Denies  paresthesias, frequent falls, frequent headaches Psych: UPSET SOME WITH DIVORCE, BUT MUTUAL, COMING ON A LONG TIME Endocrine: Denies cold intolerance, heat intolerance, polydipsia Heme: Denies enlarged lymph nodes Allergy: No hayfever  Objective:   BP 140/92 mmHg  Pulse 98  Temp(Src) 97.6 F (36.4 C) (Oral)  Ht 5' 4.5" (1.638 m)  Wt 224 lb 4 oz (101.719 kg)  BMI 37.91 kg/m2 Ideal Body Weight: Weight in (lb) to have BMI = 25: 147.6  No exam data present  GEN: well developed, well nourished, no acute distress Eyes: conjunctiva and lids normal, PERRLA, EOMI ENT: TM clear, nares clear, oral exam WNL Neck: supple, no lymphadenopathy, no thyromegaly, no JVD Pulm: clear to auscultation and  percussion, respiratory effort normal CV: regular rate and rhythm, S1-S2, no murmur, rub or gallop, no bruits, peripheral pulses normal and symmetric, no cyanosis, clubbing, edema or varicosities GI: soft, non-tender; no hepatosplenomegaly, masses; active bowel sounds all quadrants GU: no hernia, testicular mass, penile discharge Lymph: no cervical, axillary or inguinal adenopathy MSK: gait normal, muscle tone and strength WNL, no joint swelling, effusions, discoloration, crepitus  SKIN: clear, good turgor, color WNL, no rashes, lesions, or ulcerations Neuro: normal mental status, normal strength, sensation, and motion Psych: alert; oriented to person, place and time, normally interactive and not anxious or depressed in appearance. All labs reviewed with patient.  Lipids:    Component Value Date/Time   CHOL 162 12/12/2014 0830   TRIG 259.0* 12/12/2014 0830   HDL 45.30 12/12/2014 0830   LDLDIRECT 91.0 12/12/2014 0830   VLDL 51.8* 12/12/2014 0830   CHOLHDL 4 12/12/2014 0830  CBC: CBC Latest Ref Rng 12/12/2014 09/29/2013  WBC 4.0 - 10.5 K/uL 8.2 7.2  Hemoglobin 13.0 - 17.0 g/dL 13.1 13.7  Hematocrit 39.0 - 52.0 % 38.9(L) 40.4  Platelets 150.0 - 400.0 K/uL 254.0 620.3    Basic Metabolic Panel:    Component Value Date/Time   NA 137 12/12/2014 0830   K 4.3 12/12/2014 0830   CL 94* 12/12/2014 0830   CO2 32 12/12/2014 0830   BUN 16 12/12/2014 0830   CREATININE 0.77 12/12/2014 0830   GLUCOSE 197* 12/12/2014 0830   CALCIUM 10.1 12/12/2014 0830   Hepatic Function Latest Ref Rng 12/12/2014 12/31/2013 09/29/2013  Total Protein 6.0 - 8.3 g/dL 7.1 7.8 7.9  Albumin 3.5 - 5.2 g/dL 4.3 3.6 4.2  AST 0 - 37 U/L 19 56(H) 58(H)  ALT 0 - 53 U/L 38 79(H) 74(H)  Alk Phosphatase 39 - 117 U/L 59 53 50  Total Bilirubin 0.2 - 1.2 mg/dL 0.5 0.8 0.8  Bilirubin, Direct 0.0 - 0.3 mg/dL 0.1 0.0 0.1    No results found for: TSH Lab Results  Component Value Date   PSA 0.95 12/12/2014     Assessment and Plan:   Healthcare maintenance  >15 minutes spent in face to face time with patient, >50% spent in counselling or coordination of care: spent in counseling regarding the patient's ongoing divorce, diabetes management, hypertensive management, lipid management, and obesity.  Need for prophylactic vaccination against Streptococcus pneumoniae (pneumococcus) - Plan: Pneumococcal polysaccharide vaccine 23-valent greater than or equal to 2yo subcutaneous/IM  Need for prophylactic vaccination with combined diphtheria-tetanus-pertussis (DTP) vaccine - Plan: Tdap vaccine greater than or equal to 7yo IM  Need for prophylactic vaccination and inoculation against influenza - Plan: Flu Vaccine QUAD 36+ mos IM  Essential hypertension: primary weight loss and improved eating and exercise  Hypogonadism male  Hyperlipidemia LDL goal <70: improved after statin, trigs still somewhat high  Obesity (BMI 30-39.9)  DM 2: uncontrolled. Likely much of this is to worsened eating habits and stress in the last 6 months.  We're going increase his metformin to 1000 mg twice a day, and he is going to diligently work on getting back on course with diet and exercise.  Recheck an A1c in 3 months.  Health Maintenance Exam: The patient's preventative maintenance and recommended screening tests for an annual wellness exam were reviewed in full today. Brought up to date unless services declined.  Counselled on the importance of diet, exercise, and its role in overall health and mortality. The patient's FH and SH was reviewed, including their home life, tobacco status, and drug and alcohol status.  Follow-up: Return in about 3 months (around 03/17/2015). Unless noted, follow-up in 1 year for Health Maintenance Exam.  New Prescriptions   NONFORMULARY OR COMPOUNDED ITEM    Epic Account: [id number] ([facility]) A1c, E11.9   Modified Medications   Modified Medication Previous Medication   METFORMIN  (GLUCOPHAGE) 500 MG TABLET metFORMIN (GLUCOPHAGE) 500 MG tablet      Take 2 tablets (1,000 mg total) by mouth 2 (two) times daily with a meal.    TAKE 1 TABLET (500 MG TOTAL) BY MOUTH 2 (TWO) TIMES DAILY WITH A MEAL.   Orders Placed This Encounter  Procedures  . Flu Vaccine QUAD 36+ mos IM  . Tdap vaccine greater than or equal to 7yo IM  . Pneumococcal polysaccharide vaccine 23-valent greater than or equal to 2yo subcutaneous/IM    Signed,  Janett Kamath T. Norelle Runnion, MD   Patient's Medications  New Prescriptions   NONFORMULARY OR COMPOUNDED ITEM    Epic Account: [id number] ([facility]) A1c, E11.9  Previous Medications   ALLOPURINOL (ZYLOPRIM) 100 MG TABLET    TAKE 1 TABLET (100 MG TOTAL) BY MOUTH DAILY.   ATORVASTATIN (LIPITOR) 20 MG TABLET    Take 1 tablet (20 mg total) by mouth daily.   CHLORTHALIDONE (HYGROTON) 25 MG TABLET    TAKE 1 TABLET (25 MG TOTAL) BY MOUTH DAILY.   COLCHICINE 0.6 MG TABLET    Take 1 tablet (0.6 mg total) by mouth daily. Take two tablets at onset on symptoms for gout   FEXOFENADINE (ALLEGRA) 180 MG TABLET    Take 180 mg by mouth daily.     FLUTICASONE (FLONASE) 50 MCG/ACT NASAL SPRAY    Place 2 sprays into both nostrils daily.   TESTOSTERONE (ANDROGEL PUMP) 20.25 MG/ACT (1.62%) GEL    APPLY 2 PUMPS DAILY ONTO CLEAN, DRY SHOULDERS.   VALSARTAN (DIOVAN) 80 MG TABLET    TAKE 1 TABLET (80 MG TOTAL) BY MOUTH DAILY.   VITAMIN D, ERGOCALCIFEROL, (DRISDOL) 50000 UNITS CAPS CAPSULE    TAKE 1 CAPSULE BY MOUTH EVERY 7 DAYS.  Modified Medications   Modified Medication Previous Medication   METFORMIN (GLUCOPHAGE) 500 MG TABLET metFORMIN (GLUCOPHAGE) 500 MG tablet      Take 2 tablets (1,000 mg total) by mouth 2 (two) times daily with a meal.    TAKE 1 TABLET (500 MG TOTAL) BY MOUTH 2 (TWO) TIMES DAILY WITH A MEAL.  Discontinued Medications   No medications on file

## 2014-12-17 ENCOUNTER — Encounter: Payer: Self-pay | Admitting: Family Medicine

## 2014-12-21 LAB — HM DIABETES EYE EXAM

## 2014-12-28 ENCOUNTER — Other Ambulatory Visit: Payer: Self-pay | Admitting: *Deleted

## 2014-12-28 MED ORDER — VALSARTAN 80 MG PO TABS: ORAL_TABLET | ORAL | Status: DC

## 2014-12-28 MED ORDER — ATORVASTATIN CALCIUM 20 MG PO TABS: 20.0000 mg | ORAL_TABLET | Freq: Every day | ORAL | Status: DC

## 2014-12-28 MED ORDER — CHLORTHALIDONE 25 MG PO TABS: ORAL_TABLET | ORAL | Status: DC

## 2014-12-29 ENCOUNTER — Encounter: Payer: Self-pay | Admitting: Family Medicine

## 2015-01-23 ENCOUNTER — Other Ambulatory Visit: Payer: Self-pay | Admitting: Family Medicine

## 2015-03-08 ENCOUNTER — Other Ambulatory Visit: Payer: Self-pay | Admitting: Family Medicine

## 2015-03-09 NOTE — Telephone Encounter (Signed)
Mr. Regehr notified to start taking OTC Vitamin D 2000 units daily.  Will check Vit D level at next physical per Dr. Lorelei Pont.

## 2015-03-09 NOTE — Telephone Encounter (Signed)
D/c  Restart OTC Vit D 2,000 units a day  Recheck at physical next year

## 2015-03-09 NOTE — Telephone Encounter (Signed)
Last office visit 12/15/2014.  Last refilled 09/27/2014 for #12 with no refills.  No record of Vitamin D level in labs.  Refill?

## 2015-03-18 ENCOUNTER — Other Ambulatory Visit: Payer: Self-pay | Admitting: Family Medicine

## 2015-05-04 ENCOUNTER — Ambulatory Visit (INDEPENDENT_AMBULATORY_CARE_PROVIDER_SITE_OTHER): Payer: BLUE CROSS/BLUE SHIELD | Admitting: Family Medicine

## 2015-05-04 ENCOUNTER — Encounter: Payer: Self-pay | Admitting: Family Medicine

## 2015-05-04 ENCOUNTER — Telehealth: Payer: Self-pay | Admitting: Family Medicine

## 2015-05-04 VITALS — BP 126/84 | HR 91 | Temp 97.4°F | Ht 64.5 in | Wt 231.8 lb

## 2015-05-04 DIAGNOSIS — R1011 Right upper quadrant pain: Secondary | ICD-10-CM

## 2015-05-04 DIAGNOSIS — R1013 Epigastric pain: Secondary | ICD-10-CM

## 2015-05-04 LAB — BASIC METABOLIC PANEL
BUN: 16 mg/dL (ref 6–23)
CALCIUM: 10.1 mg/dL (ref 8.4–10.5)
CO2: 30 meq/L (ref 19–32)
CREATININE: 0.77 mg/dL (ref 0.40–1.50)
Chloride: 95 mEq/L — ABNORMAL LOW (ref 96–112)
GFR: 111.06 mL/min (ref >60.00)
GLUCOSE: 273 mg/dL — AB (ref 70–99)
Potassium: 4.4 mEq/L (ref 3.5–5.1)
Sodium: 134 mEq/L — ABNORMAL LOW (ref 135–145)

## 2015-05-04 LAB — CBC WITH DIFFERENTIAL/PLATELET
BASOS ABS: 0.1 10*3/uL (ref 0.0–0.1)
Basophils Relative: 1.5 % (ref 0.0–3.0)
EOS ABS: 0.2 10*3/uL (ref 0.0–0.7)
Eosinophils Relative: 2.7 % (ref 0.0–5.0)
HEMATOCRIT: 40.3 % (ref 39.0–52.0)
HEMOGLOBIN: 13.7 g/dL (ref 13.0–17.0)
LYMPHS PCT: 26 % (ref 12.0–46.0)
Lymphs Abs: 2.4 10*3/uL (ref 0.7–4.0)
MCHC: 33.9 g/dL (ref 30.0–36.0)
MCV: 90.6 fl (ref 78.0–100.0)
MONOS PCT: 4.9 % (ref 3.0–12.0)
Monocytes Absolute: 0.5 10*3/uL (ref 0.1–1.0)
NEUTROS ABS: 6.1 10*3/uL (ref 1.4–7.7)
Neutrophils Relative %: 64.9 % (ref 43.0–77.0)
Platelets: 268 10*3/uL (ref 150.0–400.0)
RBC: 4.45 Mil/uL (ref 4.22–5.81)
RDW: 12.6 % (ref 11.5–15.5)
WBC: 9.3 10*3/uL (ref 4.0–10.5)

## 2015-05-04 LAB — LIPASE: Lipase: 15 U/L (ref 11.0–59.0)

## 2015-05-04 LAB — HEPATIC FUNCTION PANEL
ALK PHOS: 64 U/L (ref 39–117)
ALT: 64 U/L — AB (ref 0–53)
AST: 36 U/L (ref 0–37)
Albumin: 4.8 g/dL (ref 3.5–5.2)
BILIRUBIN DIRECT: 0.1 mg/dL (ref 0.0–0.3)
TOTAL PROTEIN: 8.1 g/dL (ref 6.0–8.3)
Total Bilirubin: 0.5 mg/dL (ref 0.2–1.2)

## 2015-05-04 LAB — H. PYLORI ANTIBODY, IGG: H Pylori IgG: NEGATIVE

## 2015-05-04 NOTE — Telephone Encounter (Signed)
Mr. Eulberg notified as instructed by telephone.

## 2015-05-04 NOTE — Progress Notes (Signed)
Dr. Frederico Hamman T. Sigismund Cross, MD, [facility] [place], [address] Phone: [phone] Fax: [phone]  05/04/2015  Patient: Allen Howe, MRN: [id number], DOB: 1960-05-02, 55 y.o.  Primary Physician:  Owens Loffler, MD   Chief Complaint  Patient presents with  . Upper Right Quadrant Pain    on and off x 3 months  . Back Pain   Subjective:   Allen Howe is a 55 y.o. very pleasant male patient who presents with the following:  Pain in the RUQ and some in the epigastric region. On and off for 3 months. Eating bad foods will make it worse. Pizza and beer it was really hurting.   He has no history of abdominal surgery. Ongoing pain for 3 months, particularly worsened after eating fatty foods and alcohol. He has not had any trauma. He also is having some pain referred to his mid back region. No numbness, tingling, or radicular symptoms.  He has not had any bloody stools or black-colored stools. No coughing. He has not tried anything at home. It is limited him from a daily activity standpoint, he is no longer doing exercise.  Past Medical History, Surgical History, Social History, Family History, Problem List, Medications, and Allergies have been reviewed and updated if relevant.  Patient Active Problem List   Diagnosis Date Noted  . Diabetes mellitus type 2, uncontrolled, without complications (HCC)     Priority: High  . Hyperlipidemia LDL goal <70 01/06/2014  . Obesity (BMI 30-39.9) 02/15/2013  . Hypertension   . Kidney stones   . Basal cell carcinoma   . Gout   . Allergic rhinitis due to pollen   . Hypogonadism male     Past Medical History  Diagnosis Date  . Diabetes type 2, controlled ([facility])   . Basal cell carcinoma     Basal Cell on neck  . Kidney stones   . Hypertension   . Gout   . Allergic rhinitis due to pollen   . Hypogonadism male   . Hyperlipidemia LDL goal <70 01/06/2014    No past surgical  history on file.  Social History   Social History  . Marital Status: Married    Spouse Name: N/A  . Number of Children: N/A  . Years of Education: N/A   Occupational History  . Not on file.   Social History Main Topics  . Smoking status: Never Smoker   . Smokeless tobacco: Former Systems developer    Types: Chew    Quit date: 10/14/2005  . Alcohol Use: 8.4 oz/week    14 Standard drinks or equivalent per week     Comment: occ  . Drug Use: No  . Sexual Activity: Not Currently   Other Topics Concern  . Not on file   Social History Narrative   Divorced   Dual custody of children    Family History  Problem Relation Age of Onset  . Hypertension Mother   . Heart disease Father   . Hypertension Father   . Diabetes Father     No Known Allergies  Medication list reviewed and updated in full in [facility].  ROS: GEN: Acute illness details above GI: Tolerating PO intake GU: maintaining adequate hydration and urination Pulm: No SOB Interactive and getting along well at home.  Otherwise, ROS is as per the HPI.   Objective:   BP 126/84 mmHg  Pulse 91  Temp(Src) 97.4 F (36.3 C) (Oral)  Ht 5' 4.5" (1.638 m)  Wt 231 lb 12 oz (105.121 kg)  BMI 39.18 kg/m2  GEN: WDWN, NAD, Non-toxic, A & O x 3 HEENT: Atraumatic, Normocephalic. Neck supple. No masses, No LAD. Ears and Nose: No external deformity. CV: RRR, No M/G/R. No JVD. No thrill. No extra heart sounds. PULM: CTA B, no wheezes, crackles, rhonchi. No retractions. No resp. distress. No accessory muscle use. ABD: S, RUQ Tenderness, ND, +BS. No rebound. No HSM. EXTR: No c/c/e NEURO Normal gait.  PSYCH: Normally interactive. Conversant. Not depressed or anxious appearing.  Calm demeanor.     Laboratory and Imaging Data:  Assessment and Plan:   Abdominal pain, right upper quadrant - Plan: CBC with Differential/Platelet, Hepatic function panel, Lipase, H. pylori antibody, IgG, Basic metabolic panel, US Abdomen Limited  RUQ  Abdominal pain, epigastric - Plan: CBC with Differential/Platelet, Hepatic function panel, Lipase, H. pylori antibody, IgG, Basic metabolic panel, US Abdomen Limited RUQ  History and exam most consistent with gallbladder disease. Check basic labs in the upper abdomen to exclude potential liver, pancreatic issues or possibly ulcer.  Obtain a right upper quadrant ultrasound to evaluate the patient's gallbladder. If abnormal, consult general surgery.  Follow-up: No Follow-up on file.  Orders Placed This Encounter  Procedures  . US Abdomen Limited RUQ  . CBC with Differential/Platelet  . Hepatic function panel  . Lipase  . H. pylori antibody, IgG  . Basic metabolic panel    Signed,  Frederico Hamman T. Gillie Crisci, MD   Patient's Medications  New Prescriptions   No medications on file  Previous Medications   ALLOPURINOL (ZYLOPRIM) 100 MG TABLET    TAKE 1 TABLET (100 MG TOTAL) BY MOUTH DAILY.   ATORVASTATIN (LIPITOR) 20 MG TABLET    Take 1 tablet (20 mg total) by mouth daily.   CHLORTHALIDONE (HYGROTON) 25 MG TABLET    TAKE 1 TABLET (25 MG TOTAL) BY MOUTH DAILY.   CHOLECALCIFEROL (VITAMIN D3) 2000 UNITS TABS    Take 1 tablet by mouth daily.   COLCHICINE 0.6 MG TABLET    Take 1 tablet (0.6 mg total) by mouth daily. Take two tablets at onset on symptoms for gout   FEXOFENADINE (ALLEGRA) 180 MG TABLET    Take 180 mg by mouth daily.     FLUTICASONE (FLONASE) 50 MCG/ACT NASAL SPRAY    PLACE 2 SPRAYS INTO BOTH NOSTRILS DAILY.   METFORMIN (GLUCOPHAGE) 500 MG TABLET    Take 2 tablets (1,000 mg total) by mouth 2 (two) times daily with a meal.   NONFORMULARY OR COMPOUNDED ITEM    Epic Account: [id number] ([facility]) A1c, E11.9   TESTOSTERONE (ANDROGEL PUMP) 20.25 MG/ACT (1.62%) GEL    APPLY 2 PUMPS DAILY ONTO CLEAN, DRY SHOULDERS.   VALSARTAN (DIOVAN) 80 MG TABLET    TAKE 1 TABLET (80 MG TOTAL) BY MOUTH DAILY.  Modified Medications   No medications on file  Discontinued Medications   VITAMIN D,  ERGOCALCIFEROL, (DRISDOL) 50000 UNITS CAPS CAPSULE    TAKE 1 CAPSULE BY MOUTH EVERY 7 DAYS.

## 2015-05-04 NOTE — Patient Instructions (Signed)

## 2015-05-04 NOTE — Progress Notes (Signed)
Pre visit review using our clinic review tool, if applicable. No additional management support is needed unless otherwise documented below in the visit note. 

## 2015-05-04 NOTE — Telephone Encounter (Signed)
No. He can take Tylenol.   We need to identify his source and come up with a definitive plan.

## 2015-05-04 NOTE — Telephone Encounter (Signed)
Pt forgot to ask if there is any pain medication he can take for his pain? Please advise

## 2015-05-08 ENCOUNTER — Ambulatory Visit (HOSPITAL_BASED_OUTPATIENT_CLINIC_OR_DEPARTMENT_OTHER)
Admission: RE | Admit: 2015-05-08 | Discharge: 2015-05-08 | Disposition: A | Discharge status: Home / Self Care | Payer: BLUE CROSS/BLUE SHIELD | Source: Ambulatory Visit | Attending: Family Medicine | Admitting: Family Medicine

## 2015-05-08 DIAGNOSIS — R1011 Right upper quadrant pain: Secondary | ICD-10-CM | POA: Insufficient documentation

## 2015-05-08 DIAGNOSIS — R1013 Epigastric pain: Secondary | ICD-10-CM | POA: Diagnosis not present

## 2015-05-08 DIAGNOSIS — K824 Cholesterolosis of gallbladder: Secondary | ICD-10-CM | POA: Insufficient documentation

## 2015-05-08 DIAGNOSIS — R932 Abnormal findings on diagnostic imaging of liver and biliary tract: Secondary | ICD-10-CM | POA: Insufficient documentation

## 2015-05-10 ENCOUNTER — Other Ambulatory Visit: Payer: Self-pay | Admitting: Family Medicine

## 2015-05-10 DIAGNOSIS — R1011 Right upper quadrant pain: Secondary | ICD-10-CM

## 2015-05-10 DIAGNOSIS — R1013 Epigastric pain: Secondary | ICD-10-CM

## 2015-05-12 ENCOUNTER — Telehealth: Payer: Self-pay | Admitting: Gastroenterology

## 2015-05-12 NOTE — Telephone Encounter (Signed)
Spoke with patient and told him we cannot give advise since he has not been to our clinic yet. He states he just wants to know why he hurts and his OV is not until 05/23/15. Explained to patient he has been given the earliest [place]. Patient states no one cares and hung up on me.

## 2015-05-23 ENCOUNTER — Other Ambulatory Visit: Payer: Self-pay | Admitting: *Deleted

## 2015-05-23 ENCOUNTER — Telehealth: Payer: Self-pay | Admitting: *Deleted

## 2015-05-23 ENCOUNTER — Ambulatory Visit (INDEPENDENT_AMBULATORY_CARE_PROVIDER_SITE_OTHER): Payer: BLUE CROSS/BLUE SHIELD | Admitting: Gastroenterology

## 2015-05-23 ENCOUNTER — Encounter: Payer: Self-pay | Admitting: Gastroenterology

## 2015-05-23 VITALS — BP 142/76 | HR 82 | Ht 66.0 in | Wt 227.0 lb

## 2015-05-23 DIAGNOSIS — Z1211 Encounter for screening for malignant neoplasm of colon: Secondary | ICD-10-CM

## 2015-05-23 DIAGNOSIS — R1011 Right upper quadrant pain: Secondary | ICD-10-CM | POA: Diagnosis not present

## 2015-05-23 DIAGNOSIS — K219 Gastro-esophageal reflux disease without esophagitis: Secondary | ICD-10-CM

## 2015-05-23 MED ORDER — NA SULFATE-K SULFATE-MG SULF 17.5-3.13-1.6 GM/177ML PO SOLN: ORAL | Status: DC

## 2015-05-23 MED ORDER — METHYLPREDNISOLONE 4 MG PO TBPK: ORAL_TABLET | ORAL | Status: DC

## 2015-05-23 NOTE — Patient Instructions (Addendum)
We sent prescriptions to [facility]. 1. Moedrol dose pack Take as directed.  2. Suprep for the colonoscopy  You have been scheduled for an endoscopy and colonoscopy. Please follow the written instructions given to you at your visit today. Please pick up your prep supplies at the pharmacy within the next 1-3 days. If you use inhalers (even only as needed), please bring them with you on the day of your procedure. Your physician has requested that you go to www.[email or web address].com and enter the access code given to you at your visit today. This web site gives a general overview about your procedure. However, you should still follow specific instructions given to you by our office regarding your preparation for the procedure.     Today your blood pressure was elevated. Please follow up with your Primary Care Provider for blood pressure management.   If you are age 56 or younger, your body mass index should be between 19-25. Your Body mass index is 36.66 kg/(m^2). If this is out of the aformentioned range listed, please consider follow up with your Primary Care Provider.

## 2015-05-23 NOTE — Progress Notes (Signed)
05/23/2015 Allen Howe [id number] [birth year]   HISTORY OF PRESENT ILLNESS:  This is a 55 year old male who is new to our practice and has been referred here by his PCP, Dr. Lorelei Pont, for evaluation of right upper quadrant/right-sided abdominal pain. Patient told me that this began in December and seems to be worsening. He points to his right upper quadrant, but also over his right chest wall. He tells me that the pain is worsened significantly by motion/movement including riding his bike, bouncing around and the car, etc, but then he also says that sometimes it is affected by eating very fatty foods. He also mentions that sometimes it hurts for his shirt to touch him in that area. He denies any rash. He does admit to having back problems as well. He describes it as a burning pain at times.  The pain is constant.  He is currently on omeprazole OTC daily and admits to only very occasional symptomatic heartburn/reflux. His PCP ordered an abdominal ultrasound, which showed fatty infiltration of the liver and a 4 mm gallbladder polyp but no stones or biliary distention. CBC, hepatic function panel, lipase, BMP, and H. pylori IgG were all negative/unremarkable except elevated blood sugar and a mildly elevated ALT. He does admit that previously he was quite heavy drinker approximately 10 beers per day.  He says that this pain is severely impacting his life recently.  Admits that Aleve does seem to help some when he takes it.   Past Medical History  Diagnosis Date  . Diabetes type 2, controlled ([place])   . Basal cell carcinoma     Basal Cell on neck  . Kidney stones   . Hypertension   . Gout   . Allergic rhinitis due to pollen   . Hypogonadism male   . Hyperlipidemia LDL goal <70 01/06/2014   Past Surgical History  Procedure Laterality Date  . Vasectomy      reports that he has never smoked. He quit smokeless tobacco use about 9 years ago. His smokeless tobacco use included Chew. He reports  that he drinks about 8.4 oz of alcohol per week. He reports that he does not use illicit drugs. family history includes Diabetes in his father; Gallbladder disease in his mother; Heart disease in his father; Hypertension in his father and mother. There is no history of Colon cancer. No Known Allergies    Outpatient Encounter Prescriptions as of 05/23/2015  Medication Sig  . allopurinol (ZYLOPRIM) 100 MG tablet TAKE 1 TABLET (100 MG TOTAL) BY MOUTH DAILY.  Marland Kitchen atorvastatin (LIPITOR) 20 MG tablet Take 1 tablet (20 mg total) by mouth daily.  . chlorthalidone (HYGROTON) 25 MG tablet TAKE 1 TABLET (25 MG TOTAL) BY MOUTH DAILY.  Marland Kitchen Cholecalciferol (VITAMIN D3) 2000 units TABS Take 1 tablet by mouth daily.  . colchicine 0.6 MG tablet Take 1 tablet (0.6 mg total) by mouth daily. Take two tablets at onset on symptoms for gout  . fexofenadine (ALLEGRA) 180 MG tablet Take 180 mg by mouth daily.    . fluticasone (FLONASE) 50 MCG/ACT nasal spray PLACE 2 SPRAYS INTO BOTH NOSTRILS DAILY.  . metFORMIN (GLUCOPHAGE) 500 MG tablet Take 2 tablets (1,000 mg total) by mouth 2 (two) times daily with a meal.  . NONFORMULARY OR COMPOUNDED ITEM Epic Account: [id number] ([facility]) A1c, E11.9  . Testosterone (ANDROGEL PUMP) 20.25 MG/ACT (1.62%) GEL APPLY 2 PUMPS DAILY ONTO CLEAN, DRY SHOULDERS.  . valsartan (DIOVAN) 80 MG tablet TAKE 1 TABLET (80 MG TOTAL)  BY MOUTH DAILY.  . methylPREDNISolone (MEDROL DOSEPAK) 4 MG TBPK tablet Take as directed.  . Na Sulfate-K Sulfate-Mg Sulf SOLN Take as directed for colonoscopy   No facility-administered encounter medications on file as of 05/23/2015.     REVIEW OF SYSTEMS  : All other systems reviewed and negative except where noted in the History of Present Illness.   PHYSICAL EXAM: BP 142/76 mmHg  Pulse 82  Ht 5\' 6"  (1.676 m)  Wt 227 lb (102.967 kg)  BMI 36.66 kg/m2 General: Well developed white male in no acute distress Head: Normocephalic and atraumatic Eyes:  Sclerae  anicteric, conjunctiva pink. Ears: Normal auditory acuity Lungs: Clear throughout to auscultation; tenderness on right chest wall/lower rib cage. Heart: Regular rate and rhythm Abdomen: Soft, non-distended.  Normal bowel sounds.  Non-tender. Musculoskeletal: Symmetrical with no gross deformities  Skin: No lesions on visible extremities Extremities: No edema  Neurological: Alert oriented x 4, grossly non-focal Psychological:  Alert and cooperative. Normal mood and affect  ASSESSMENT AND PLAN: -RUQ abdominal pain:  I am not sure what is causing his pain at this point. Some components sound musculoskeletal, other components sound very superficial almost like shingles but he's had no rash, other components sound gastrointestinal. On exam he has tenderness over his lower rib cage, but no abdominal tenderness. I am questioning whether he has costochondritis. I'm going to treat him with a Medrol Dosepak to see if this improves his symptoms. Since he needs a colonoscopy for screening purposes we will schedule an EGD for now as well in case there is no improvement with the Medrol Dosepak. We'll rule out ulcer disease, reflux esophagitis, etc. He is on omeprazole OTC daily currently. -Screening colonoscopy:  Never had one in the past.  Will schedule with Dr. Henrene Pastor.    *The risks, benefits, and alternatives to EGD and colonoscopy were discussed with the patient and he consents to proceed.   CC:  Owens Loffler, MD

## 2015-05-23 NOTE — Telephone Encounter (Signed)
Lm for patient , advising we got the message from [facility] that you were asking for a less expensive prep.  Advised him I put a sample of Moviprep at the front desk with new instructions.

## 2015-05-24 NOTE — Progress Notes (Signed)
Agree with initial assessment and plan. Could consider fasting imaging if EGD negative and diagnosis at all in question

## 2015-05-26 ENCOUNTER — Telehealth: Payer: Self-pay | Admitting: *Deleted

## 2015-05-26 NOTE — Telephone Encounter (Signed)
LM for The patient advising his colonoscopy is scheduled for 07-05-2015.  I advised I got a fax from the pharmacy stating he was asking for a cheaper prep.  I Lm that we don't have enough samples for all who want them but I will try to get him a sample if I can.  I cant promise anything.

## 2015-06-07 ENCOUNTER — Telehealth: Payer: Self-pay | Admitting: *Deleted

## 2015-06-07 NOTE — Telephone Encounter (Signed)
Advised the patient we got some samples of the Suprep in and we were able to put a sample at the front desk for him . I urged him to pick it up within the next 2 days.  He said he would .

## 2015-06-09 ENCOUNTER — Telehealth: Payer: Self-pay

## 2015-06-09 NOTE — Telephone Encounter (Signed)
Spoke with patient whose insurance will not cover his colonoscopy prep.  Told him I would put a sample up front for him to pick up.  Patient agreed.

## 2015-06-22 ENCOUNTER — Encounter: Payer: Self-pay | Admitting: Internal Medicine

## 2015-07-05 ENCOUNTER — Encounter: Payer: Self-pay | Admitting: Internal Medicine

## 2015-07-05 ENCOUNTER — Other Ambulatory Visit: Payer: Self-pay

## 2015-07-05 ENCOUNTER — Ambulatory Visit (AMBULATORY_SURGERY_CENTER): Payer: BLUE CROSS/BLUE SHIELD | Admitting: Internal Medicine

## 2015-07-05 ENCOUNTER — Other Ambulatory Visit (INDEPENDENT_AMBULATORY_CARE_PROVIDER_SITE_OTHER): Payer: BLUE CROSS/BLUE SHIELD

## 2015-07-05 ENCOUNTER — Telehealth: Payer: Self-pay

## 2015-07-05 VITALS — BP 158/103 | HR 88 | Temp 99.6°F | Resp 22 | Ht 66.0 in | Wt 227.0 lb

## 2015-07-05 DIAGNOSIS — K219 Gastro-esophageal reflux disease without esophagitis: Secondary | ICD-10-CM | POA: Diagnosis not present

## 2015-07-05 DIAGNOSIS — R1010 Upper abdominal pain, unspecified: Secondary | ICD-10-CM

## 2015-07-05 DIAGNOSIS — Z1211 Encounter for screening for malignant neoplasm of colon: Secondary | ICD-10-CM | POA: Diagnosis not present

## 2015-07-05 DIAGNOSIS — R1011 Right upper quadrant pain: Secondary | ICD-10-CM

## 2015-07-05 DIAGNOSIS — R1012 Left upper quadrant pain: Secondary | ICD-10-CM

## 2015-07-05 LAB — BASIC METABOLIC PANEL
BUN: 10 mg/dL (ref 6–23)
CHLORIDE: 97 meq/L (ref 96–112)
CO2: 28 meq/L (ref 19–32)
Calcium: 9.1 mg/dL (ref 8.4–10.5)
Creatinine, Ser: 0.77 mg/dL (ref 0.40–1.50)
GFR: 110.99 mL/min (ref >60.00)
Glucose, Bld: 144 mg/dL — ABNORMAL HIGH (ref 70–99)
POTASSIUM: 3.5 meq/L (ref 3.5–5.1)
SODIUM: 135 meq/L (ref 135–145)

## 2015-07-05 MED ORDER — SODIUM CHLORIDE 0.9 % IV SOLN: 500.0000 mL | INTRAVENOUS | Status: DC

## 2015-07-05 MED ORDER — OMEPRAZOLE 40 MG PO CPDR: 40.0000 mg | DELAYED_RELEASE_CAPSULE | Freq: Every day | ORAL | Status: DC

## 2015-07-05 NOTE — Op Note (Signed)
[place] Patient Name: Allen Howe Procedure Date: 07/05/2015 1:21 PM MRN: [id number] Endoscopist: Docia Chuck. Henrene Pastor , MD Age: 55 Referring MD:  Date of Birth: 10-11-1960 Gender: Male Procedure:                Colonoscopy Indications:              Screening for colorectal malignant neoplasm Medicines:                Monitored Anesthesia Care Procedure:                Pre-Anesthesia Assessment:                           - Prior to the procedure, a History and Physical                            was performed, and patient medications and                            allergies were reviewed. The patient's tolerance of                            previous anesthesia was also reviewed. The risks                            and benefits of the procedure and the sedation                            options and risks were discussed with the patient.                            All questions were answered, and informed consent                            was obtained. Prior Anticoagulants: The patient has                            taken no previous anticoagulant or antiplatelet                            agents. ASA Grade Assessment: II - A patient with                            mild systemic disease. After reviewing the risks                            and benefits, the patient was deemed in                            satisfactory condition to undergo the procedure.                           After obtaining informed consent, the colonoscope  was passed under direct vision. Throughout the                            procedure, the patient's blood pressure, pulse, and                            oxygen saturations were monitored continuously. The                            Model CF-HQ190L [id number]) scope was introduced                            through the anus and advanced to the the cecum,                            identified by appendiceal orifice and  ileocecal                            valve. The ileocecal valve, appendiceal orifice,                            and rectum were photographed. The quality of the                            bowel preparation was excellent. The colonoscopy                            was performed without difficulty. The patient                            tolerated the procedure well. The bowel preparation                            used was SUPREP. Scope In: 1:26:58 PM Scope Out: 1:36:46 PM Scope Withdrawal Time: 0 hours 8 minutes 37 seconds  Total Procedure Duration: 0 hours 9 minutes 48 seconds  Findings:                 The digital rectal exam was normal.                           Multiple diverticula were found in the entire colon.                           The exam was otherwise without abnormality on                            direct and retroflexion views, except for internal                            hemorrhoids. Complications:            No immediate complications. Estimated blood loss:  None. Estimated Blood Loss:     Estimated blood loss: none. Impression:               - Diverticulosis in the entire examined colon.                           - The examination was otherwise normal on direct                            and retroflexion views. Recommendation:           - Repeat colonoscopy in 10 years for screening                            purposes.                           - EGD today. Please see report for findings and                            final recommendations. Docia Chuck. Henrene Pastor, MD 07/05/2015 1:40:39 PM This report has been signed electronically. CC Letter to:             Spencer T. Copland, MD

## 2015-07-05 NOTE — Patient Instructions (Addendum)

## 2015-07-05 NOTE — Progress Notes (Signed)
Spoke with Pam in 3rd Floor GI offices.  Advised her that pt. Needs lab related  CT scan for pts. With diabetes.  Also asked if she could scheduled at [facility] in [place], and that Dr. Henrene Pastor wants to see pt. In his office in 6 weeks as outlined on procedure report.  She verbalized understanding.

## 2015-07-05 NOTE — Telephone Encounter (Signed)
Pt to have BMET today. Pt scheduled for CT of Abdomen at [place] [facility] 07/07/15@10am . Pt to arrive there at 9:45am. Pt to be NPO after 8am, drink bottle of contrast at 9am.

## 2015-07-05 NOTE — Progress Notes (Signed)
A and Ox 3 Report to RN 

## 2015-07-05 NOTE — Op Note (Signed)
[place] Patient Name: Allen Howe Procedure Date: 07/05/2015 1:20 PM MRN: [id number] Endoscopist: Docia Chuck. Henrene Pastor , MD Age: 55 Referring MD:  Date of Birth: 1960-06-20 Gender: Male Procedure:                Upper GI endoscopy Indications:              Epigastric abdominal pain, Abdominal pain in the                            right upper quadrant, Suspected esophageal reflux.                            Right upper quadrant pain improved somewhat after                            Medrol Dosepak last month. Still with upper                            abdominal burning discomfort despite                            over-the-counter Prilosec Medicines:                Monitored Anesthesia Care Procedure:                Pre-Anesthesia Assessment:                           - Prior to the procedure, a History and Physical                            was performed, and patient medications and                            allergies were reviewed. The patient's tolerance of                            previous anesthesia was also reviewed. The risks                            and benefits of the procedure and the sedation                            options and risks were discussed with the patient.                            All questions were answered, and informed consent                            was obtained. Prior Anticoagulants: The patient has                            taken no previous anticoagulant or antiplatelet  agents. ASA Grade Assessment: II - A patient with                            mild systemic disease. After reviewing the risks                            and benefits, the patient was deemed in                            satisfactory condition to undergo the procedure.                           After obtaining informed consent, the endoscope was                            passed under direct vision. Throughout the     procedure, the patient's blood pressure, pulse, and                            oxygen saturations were monitored continuously. The                            Model GIF-[id number]) scope was introduced                            through the mouth, and advanced to the second part                            of duodenum. The upper GI endoscopy was                            accomplished without difficulty. The patient                            tolerated the procedure well. Scope In: Scope Out: Findings:                 The esophagus was normal.                           The stomach was normal.                           The examined duodenum was normal.                           The cardia and gastric fundus were normal on                            retroflexion. Complications:            No immediate complications. Estimated Blood Loss:     Estimated blood loss: none. Impression:               - Normal EGD                           -  GERD without esophagitis                           - Upper abdominal pain Recommendation:           - 1. Prescribe omeprazole 40 mg; #30; 1 by mouth                            daily; 11 refills                           - 2. Schedule contrast-enhanced CT scan of the                            abdomen "upper abdominal pain"                           - 3. Office follow-up with Dr. Henrene Pastor in 6 weeks. Docia Chuck. Henrene Pastor, MD 07/05/2015 1:51:33 PM This report has been signed electronically. CC Letter to:             Spencer T. Copland, MD

## 2015-07-06 ENCOUNTER — Telehealth: Payer: Self-pay

## 2015-07-06 NOTE — Telephone Encounter (Signed)
Spoke with pt regarding CT scan. Pt states he does not want to have CT done, states he wants to wait and see if symptoms improve. States he will call back to schedule if still having issues. Dr. Henrene Pastor notified.

## 2015-07-06 NOTE — Telephone Encounter (Signed)
Pt does not want to have scan right now. Appt cancelled and Dr. Henrene Pastor aware.

## 2015-07-06 NOTE — Telephone Encounter (Signed)
Noted! Thank you

## 2015-07-06 NOTE — Telephone Encounter (Signed)
  Follow up Call-  Call back number 07/05/2015  Post procedure Call Back phone  # [phone]  Permission to leave phone message Yes     Patient questions:  Do you have a fever, pain , or abdominal swelling? No. Pain Score  0 *  Have you tolerated food without any problems? Yes.    Have you been able to return to your normal activities? Yes.    Do you have any questions about your discharge instructions: Diet   No. Medications  No. Follow up visit  No.  Do you have questions or concerns about your Care? No.  Actions: * If pain score is 4 or above: No action needed, pain <4.

## 2015-07-07 ENCOUNTER — Other Ambulatory Visit: Payer: BLUE CROSS/BLUE SHIELD

## 2015-08-14 ENCOUNTER — Ambulatory Visit: Payer: BLUE CROSS/BLUE SHIELD | Admitting: Internal Medicine

## 2015-09-11 ENCOUNTER — Emergency Department
Admission: EM | Admit: 2015-09-11 | Discharge: 2015-09-11 | Disposition: A | Discharge status: Home / Self Care | Payer: BLUE CROSS/BLUE SHIELD | Source: Home / Self Care | Attending: Family Medicine | Admitting: Family Medicine

## 2015-09-11 ENCOUNTER — Encounter: Payer: Self-pay | Admitting: Emergency Medicine

## 2015-09-11 DIAGNOSIS — T07XXXA Unspecified multiple injuries, initial encounter: Secondary | ICD-10-CM

## 2015-09-11 DIAGNOSIS — L03115 Cellulitis of right lower limb: Secondary | ICD-10-CM | POA: Diagnosis not present

## 2015-09-11 MED ORDER — CLINDAMYCIN HCL 300 MG PO CAPS: 300.0000 mg | ORAL_CAPSULE | Freq: Three times a day (TID) | ORAL | 0 refills | Status: DC

## 2015-09-11 MED ORDER — BACITRACIN ZINC 500 UNIT/GM EX OINT: 1.0000 "application " | TOPICAL_OINTMENT | Freq: Two times a day (BID) | CUTANEOUS | 0 refills | Status: DC
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
## 2015-09-11 NOTE — Discharge Instructions (Signed)
You may change bandage 2-3 times a day, more often if bandage gets wet or dirty.  Please keep wounds clean with soap and water.  You may apply over the counter antibiotic ointment or use the prescribed bacitracin.  Please be sure to take the Clindamycin as prescribed. Redness might get slightly worse until antibiotic gets into your symptom in first 24 hours, however, it should not significantly worsen.  You should be able to easily move your ankle and knee without pain as you are today.  Keep leg elevated to help decrease swelling. See additional information on abrasions in cellulitis in this packet.

## 2015-10-02 ENCOUNTER — Other Ambulatory Visit: Payer: Self-pay | Admitting: Family Medicine

## 2015-12-16 ENCOUNTER — Other Ambulatory Visit: Payer: Self-pay | Admitting: Family Medicine

## 2015-12-17 NOTE — Telephone Encounter (Signed)
Please call and schedule CPE with fasting labs prior for Dr. Copland.  

## 2015-12-27 ENCOUNTER — Encounter: Payer: Self-pay | Admitting: Family Medicine

## 2015-12-27 ENCOUNTER — Ambulatory Visit (INDEPENDENT_AMBULATORY_CARE_PROVIDER_SITE_OTHER): Payer: BLUE CROSS/BLUE SHIELD | Admitting: Family Medicine

## 2015-12-27 VITALS — BP 124/96 | HR 85 | Temp 97.5°F | Ht 64.25 in | Wt 196.5 lb

## 2015-12-27 DIAGNOSIS — IMO0001 Reserved for inherently not codable concepts without codable children: Secondary | ICD-10-CM

## 2015-12-27 DIAGNOSIS — Z79899 Other long term (current) drug therapy: Secondary | ICD-10-CM | POA: Diagnosis not present

## 2015-12-27 DIAGNOSIS — Z23 Encounter for immunization: Secondary | ICD-10-CM | POA: Diagnosis not present

## 2015-12-27 DIAGNOSIS — K219 Gastro-esophageal reflux disease without esophagitis: Secondary | ICD-10-CM

## 2015-12-27 DIAGNOSIS — Z0000 Encounter for general adult medical examination without abnormal findings: Secondary | ICD-10-CM | POA: Diagnosis not present

## 2015-12-27 DIAGNOSIS — E1165 Type 2 diabetes mellitus with hyperglycemia: Secondary | ICD-10-CM

## 2015-12-27 DIAGNOSIS — R5383 Other fatigue: Secondary | ICD-10-CM

## 2015-12-27 DIAGNOSIS — E785 Hyperlipidemia, unspecified: Secondary | ICD-10-CM | POA: Diagnosis not present

## 2015-12-27 DIAGNOSIS — Z125 Encounter for screening for malignant neoplasm of prostate: Secondary | ICD-10-CM | POA: Diagnosis not present

## 2015-12-27 DIAGNOSIS — E291 Testicular hypofunction: Secondary | ICD-10-CM

## 2015-12-27 MED ORDER — SILDENAFIL CITRATE 20 MG PO TABS: ORAL_TABLET | ORAL | 11 refills | Status: DC

## 2015-12-27 MED ORDER — ALLOPURINOL 100 MG PO TABS: 100.0000 mg | ORAL_TABLET | Freq: Every day | ORAL | 3 refills | Status: DC

## 2015-12-27 MED ORDER — BUDESONIDE 32 MCG/ACT NA SUSP: 2.0000 | Freq: Every day | NASAL | 11 refills | Status: DC

## 2015-12-27 MED ORDER — CHLORTHALIDONE 25 MG PO TABS: ORAL_TABLET | ORAL | 3 refills | Status: DC

## 2015-12-27 MED ORDER — ATORVASTATIN CALCIUM 20 MG PO TABS
20.0000 mg | ORAL_TABLET | Freq: Every day | ORAL | 3 refills | Status: DC
Start: 2015-12-27 — End: 2017-01-31

## 2015-12-27 MED ORDER — VALSARTAN 80 MG PO TABS: ORAL_TABLET | ORAL | 3 refills | Status: DC

## 2015-12-27 MED ORDER — METFORMIN HCL 500 MG PO TABS: ORAL_TABLET | ORAL | 3 refills | Status: DC

## 2015-12-27 MED ORDER — OMEPRAZOLE 40 MG PO CPDR: 40.0000 mg | DELAYED_RELEASE_CAPSULE | Freq: Every day | ORAL | 3 refills | Status: DC

## 2015-12-27 NOTE — Progress Notes (Signed)
Pre visit review using our clinic review tool, if applicable. No additional management support is needed unless otherwise documented below in the visit note. 

## 2015-12-27 NOTE — Progress Notes (Signed)
Dr. Frederico Hamman T. Leman Martinek, MD, [facility] [place], [address] Phone: [phone] Fax: [phone]  12/27/2015  Patient: Allen Howe, MRN: [id number], DOB: January 11, 1961, 55 y.o.  Primary Physician:  Owens Loffler, MD   Chief Complaint  Patient presents with  . Annual Exam   Subjective:   Allen Howe is a 55 y.o. pleasant patient who presents with the following:  Preventative Health Maintenance Visit:  Health Maintenance Summary Reviewed and updated, unless pt declines services.  Tobacco History Reviewed. Alcohol: No concerns, no excessive use Exercise Habits: swimming and biking all the time, rec at least 30 mins 5 times a week STD concerns: no risk or activity to increase risk Drug Use: None Encouraged self-testicular check  Health Maintenance  Topic Date Due  . HIV Screening  04/17/1974  . HEMOGLOBIN A1C  06/12/2015  . FOOT EXAM  12/17/2015  . OPHTHALMOLOGY EXAM  12/21/2015  . PNEUMOCOCCAL POLYSACCHARIDE VACCINE (2) 12/15/2019  . TETANUS/TDAP  12/14/2024  . COLONOSCOPY  07/04/2025  . INFLUENZA VACCINE  Completed  . Hepatitis C Screening  Completed   Immunization History  Administered Date(s) Administered  . Influenza,inj,Quad PF,36+ Mos 01/05/2014, 12/15/2014, 12/27/2015  . Pneumococcal Polysaccharide-23 12/15/2014  . Tdap 12/15/2014   Patient Active Problem List   Diagnosis Date Noted  . Diabetes mellitus type 2, uncontrolled, without complications (HCC)     Priority: High  . Special screening for malignant neoplasms, colon 05/23/2015  . Gastroesophageal reflux disease without esophagitis 05/23/2015  . Hyperlipidemia LDL goal <70 01/06/2014  . Obesity (BMI 30-39.9) 02/15/2013  . Hypertension   . Kidney stones   . Basal cell carcinoma   . Gout   . Allergic rhinitis due to pollen   . Hypogonadism male    Past Medical History:  Diagnosis Date  . Allergic rhinitis due to pollen   .  Basal cell carcinoma    Basal Cell on neck  . Diabetes type 2, controlled ([place])   . Diverticulosis   . Fatty liver   . Gout   . Hyperlipidemia LDL goal <70 01/06/2014  . Hypertension   . Hypogonadism male   . Internal hemorrhoids   . Kidney stones    Past Surgical History:  Procedure Laterality Date  . VASECTOMY     Social History   Social History  . Marital status: Legally Separated    Spouse name: N/A  . Number of children: 2  . Years of education: N/A   Occupational History  . accountant    Social History Main Topics  . Smoking status: Never Smoker  . Smokeless tobacco: Former Systems developer    Types: Chew    Quit date: 10/14/2005  . Alcohol use 8.4 oz/week    14 Standard drinks or equivalent per week     Comment: occ  . Drug use: No  . Sexual activity: Not Currently   Other Topics Concern  . Not on file   Social History Narrative   Divorced   Dual custody of children   Family History  Problem Relation Age of Onset  . Hypertension Mother   . Gallbladder disease Mother   . Heart disease Father   . Hypertension Father   . Diabetes Father   . Colon cancer Neg Hx    No Known Allergies  Medication list has been reviewed and updated.   General: Denies fever, chills, sweats. No significant weight loss. Eyes: Denies blurring,significant itching ENT: Denies earache, sore  throat, and hoarseness. Cardiovascular: Denies chest pains, palpitations, dyspnea on exertion Respiratory: Denies cough, dyspnea at rest,wheeezing Breast: no concerns about lumps GI: Denies nausea, vomiting, diarrhea, constipation, change in bowel habits, abdominal pain, melena, hematochezia GU: Some ed Musculoskeletal: Denies back pain, joint pain Derm: Denies rash, itching Neuro: Denies  paresthesias, frequent falls, frequent headaches Psych: Denies depression, anxiety Endocrine: Denies cold intolerance, heat intolerance, polydipsia Heme: Denies enlarged lymph nodes Allergy: No  hayfever  Objective:   BP (!) 124/96   Pulse 85   Temp 97.5 F (36.4 C) (Oral)   Ht 5' 4.25" (1.632 m)   Wt 196 lb 8 oz (89.1 kg)   BMI 33.47 kg/m  Ideal Body Weight: Weight in (lb) to have BMI = 25: 146.5  No exam data present  GEN: well developed, well nourished, no acute distress Eyes: conjunctiva and lids normal, PERRLA, EOMI ENT: TM clear, nares clear, oral exam WNL Neck: supple, no lymphadenopathy, no thyromegaly, no JVD Pulm: clear to auscultation and percussion, respiratory effort normal CV: regular rate and rhythm, S1-S2, no murmur, rub or gallop, no bruits, peripheral pulses normal and symmetric, no cyanosis, clubbing, edema or varicosities GI: soft, non-tender; no hepatosplenomegaly, masses; active bowel sounds all quadrants GU: Some ED Lymph: no cervical, axillary or inguinal adenopathy MSK: gait normal, muscle tone and strength WNL, no joint swelling, effusions, discoloration, crepitus  SKIN: clear, good turgor, color WNL, no rashes, lesions, or ulcerations Neuro: normal mental status, normal strength, sensation, and motion Psych: alert; oriented to person, place and time, normally interactive and not anxious or depressed in appearance. All labs reviewed with patient.  Lipids:    Component Value Date/Time   CHOL 162 12/12/2014 0830   TRIG 259.0 (H) 12/12/2014 0830   HDL 45.30 12/12/2014 0830   LDLDIRECT 91.0 12/12/2014 0830   VLDL 51.8 (H) 12/12/2014 0830   CHOLHDL 4 12/12/2014 0830   CBC: CBC Latest Ref Rng & Units 05/04/2015 12/12/2014 09/29/2013  WBC 4.0 - 10.5 K/uL 9.3 8.2 7.2  Hemoglobin 13.0 - 17.0 g/dL 13.7 13.1 13.7  Hematocrit 39.0 - 52.0 % 40.3 38.9(L) 40.4  Platelets 150.0 - 400.0 K/uL 268.0 254.0 027.7    Basic Metabolic Panel:    Component Value Date/Time   NA 135 07/05/2015 1440   K 3.5 07/05/2015 1440   CL 97 07/05/2015 1440   CO2 28 07/05/2015 1440   BUN 10 07/05/2015 1440   CREATININE 0.77 07/05/2015 1440   GLUCOSE 144 (H)  07/05/2015 1440   CALCIUM 9.1 07/05/2015 1440   Hepatic Function Latest Ref Rng & Units 05/04/2015 12/12/2014 12/31/2013  Total Protein 6.0 - 8.3 g/dL 8.1 7.1 7.8  Albumin 3.5 - 5.2 g/dL 4.8 4.3 3.6  AST 0 - 37 U/L 36 19 56(H)  ALT 0 - 53 U/L 64(H) 38 79(H)  Alk Phosphatase 39 - 117 U/L 64 59 53  Total Bilirubin 0.2 - 1.2 mg/dL 0.5 0.5 0.8  Bilirubin, Direct 0.0 - 0.3 mg/dL 0.1 0.1 0.0    No results found for: TSH Lab Results  Component Value Date   PSA 0.95 12/12/2014    Assessment and Plan:   Health Maintenance Exam: The patient's preventative maintenance and recommended screening tests for an annual wellness exam were reviewed in full today. Brought up to date unless services declined.  Counselled on the importance of diet, exercise, and its role in overall health and mortality. The patient's FH and SH was reviewed, including their home life, tobacco status, and drug and  alcohol status.  Follow-up in 1 year for physical exam or additional follow-up below.  He is doing great, and seems to have turned his life around in terms of exercise and diet.  Has lost 40 pounds already, and he has not finished.  He is having some erectile difficulties, so I gave him some generic Viagra to try.  We also need to get laboratories on him, and check his testosterone level. We refilled all of his medication for him.  Healthcare maintenance  Need for prophylactic vaccination and inoculation against influenza - Plan: Flu Vaccine QUAD 36+ mos IM  Uncontrolled type 2 diabetes mellitus without complication, without long-term current use of insulin ([place]) - Plan: Basic metabolic panel, Hemoglobin A1c, Microalbumin / creatinine urine ratio  Encounter for long-term (current) use of medications - Plan: CBC with Differential/Platelet, Hepatic function panel  Hypogonadism in male - Plan: Testosterone, Free, Total, SHBG  Other fatigue - Plan: Testosterone, Free, Total, SHBG  Screening PSA (prostate  specific antigen) - Plan: PSA  Hyperlipidemia LDL goal <100 - Plan: Lipid panel  Gastroesophageal reflux disease without esophagitis - Plan: omeprazole (PRILOSEC) 40 MG capsule   Follow-up: No Follow-up on file.  Meds ordered this encounter  Medications  . DISCONTD: budesonide (RHINOCORT ALLERGY) 32 MCG/ACT nasal spray    Sig: Place 2 sprays into both nostrils daily.  . valsartan (DIOVAN) 80 MG tablet    Sig: TAKE 1 TABLET (80 MG TOTAL) BY MOUTH DAILY.    Dispense:  90 tablet    Refill:  3  . omeprazole (PRILOSEC) 40 MG capsule    Sig: Take 1 capsule (40 mg total) by mouth daily.    Dispense:  90 capsule    Refill:  3  . metFORMIN (GLUCOPHAGE) 500 MG tablet    Sig: TAKE 2 TABLETS BY MOUTH 2 TIMES DAILY WITH A MEAL.    Dispense:  360 tablet    Refill:  3  . chlorthalidone (HYGROTON) 25 MG tablet    Sig: TAKE 1 TABLET (25 MG TOTAL) BY MOUTH DAILY.    Dispense:  90 tablet    Refill:  3  . budesonide (RHINOCORT ALLERGY) 32 MCG/ACT nasal spray    Sig: Place 2 sprays into both nostrils daily.    Dispense:  8.6 g    Refill:  11  . atorvastatin (LIPITOR) 20 MG tablet    Sig: Take 1 tablet (20 mg total) by mouth daily.    Dispense:  90 tablet    Refill:  3  . allopurinol (ZYLOPRIM) 100 MG tablet    Sig: Take 1 tablet (100 mg total) by mouth daily.    Dispense:  90 tablet    Refill:  3  . sildenafil (REVATIO) 20 MG tablet    Sig: Take 2 to 5 tablets by mouth 30 minutes prior to intercourse    Dispense:  50 tablet    Refill:  11   Medications Discontinued During This Encounter  Medication Reason  . NONFORMULARY OR COMPOUNDED ITEM Completed Course  . methylPREDNISolone (MEDROL DOSEPAK) 4 MG TBPK tablet Completed Course  . fluticasone (FLONASE) 50 MCG/ACT nasal spray Completed Course  . clindamycin (CLEOCIN) 300 MG capsule Completed Course  . bacitracin ointment Completed Course  . valsartan (DIOVAN) 80 MG tablet Reorder  . omeprazole (PRILOSEC) 40 MG capsule Reorder  .  metFORMIN (GLUCOPHAGE) 500 MG tablet Reorder  . chlorthalidone (HYGROTON) 25 MG tablet Reorder  . budesonide (RHINOCORT ALLERGY) 32 MCG/ACT nasal spray Reorder  . atorvastatin (  LIPITOR) 20 MG tablet Reorder  . allopurinol (ZYLOPRIM) 100 MG tablet Reorder   Orders Placed This Encounter  Procedures  . Flu Vaccine QUAD 36+ mos IM  . Basic metabolic panel  . CBC with Differential/Platelet  . Hepatic function panel  . Lipid panel  . PSA  . Testosterone, Free, Total, SHBG  . Hemoglobin A1c  . Microalbumin / creatinine urine ratio    Signed,  Ciji Boston T. Offie Waide, MD     Medication List       Accurate as of 12/27/15 11:59 PM. Always use your most recent med list.          ALLEGRA 180 MG tablet Generic drug:  fexofenadine Take 180 mg by mouth daily.   allopurinol 100 MG tablet Commonly known as:  ZYLOPRIM Take 1 tablet (100 mg total) by mouth daily.   atorvastatin 20 MG tablet Commonly known as:  LIPITOR Take 1 tablet (20 mg total) by mouth daily.   budesonide 32 MCG/ACT nasal spray Commonly known as:  RHINOCORT ALLERGY Place 2 sprays into both nostrils daily.   chlorthalidone 25 MG tablet Commonly known as:  HYGROTON TAKE 1 TABLET (25 MG TOTAL) BY MOUTH DAILY.   colchicine 0.6 MG tablet Take 1 tablet (0.6 mg total) by mouth daily. Take two tablets at onset on symptoms for gout   metFORMIN 500 MG tablet Commonly known as:  GLUCOPHAGE TAKE 2 TABLETS BY MOUTH 2 TIMES DAILY WITH A MEAL.   omeprazole 40 MG capsule Commonly known as:  PRILOSEC Take 1 capsule (40 mg total) by mouth daily.   sildenafil 20 MG tablet Commonly known as:  REVATIO Take 2 to 5 tablets by mouth 30 minutes prior to intercourse   Testosterone 20.25 MG/ACT (1.62%) Gel Commonly known as:  ANDROGEL PUMP APPLY 2 PUMPS DAILY ONTO CLEAN, DRY SHOULDERS.   valsartan 80 MG tablet Commonly known as:  DIOVAN TAKE 1 TABLET (80 MG TOTAL) BY MOUTH DAILY.   Vitamin D3 2000 units Tabs Take 1 tablet  by mouth daily. Reported on 07/05/2015

## 2015-12-28 LAB — LIPID PANEL
CHOL/HDL RATIO: 2.2 ratio (ref <5.0)
CHOLESTEROL: 177 mg/dL (ref <200)
HDL: 80 mg/dL (ref >40)
LDL CALC: 75 mg/dL
TRIGLYCERIDES: 108 mg/dL (ref <150)
VLDL: 22 mg/dL (ref <30)

## 2015-12-28 LAB — PSA: PSA: 1.4 ng/mL (ref <=4.0)

## 2015-12-28 LAB — CBC WITH DIFFERENTIAL/PLATELET
BASOS PCT: 1 %
Basophils Absolute: 84 cells/uL (ref 0–200)
EOS PCT: 3 %
Eosinophils Absolute: 252 cells/uL (ref 15–500)
HEMATOCRIT: 39.6 % (ref 38.5–50.0)
Hemoglobin: 13.3 g/dL (ref 13.2–17.1)
LYMPHS ABS: 1932 {cells}/uL (ref 850–3900)
LYMPHS PCT: 23 %
MCH: 31.1 pg (ref 27.0–33.0)
MCHC: 33.6 g/dL (ref 32.0–36.0)
MCV: 92.5 fL (ref 80.0–100.0)
MONO ABS: 588 {cells}/uL (ref 200–950)
MPV: 9.8 fL (ref 7.5–12.5)
Monocytes Relative: 7 %
NEUTROS PCT: 66 %
Neutro Abs: 5544 cells/uL (ref 1500–7800)
Platelets: 257 10*3/uL (ref 140–400)
RBC: 4.28 MIL/uL (ref 4.20–5.80)
RDW: 14.4 % (ref 11.0–15.0)
WBC: 8.4 10*3/uL (ref 3.8–10.8)

## 2015-12-28 LAB — BASIC METABOLIC PANEL
BUN: 16 mg/dL (ref 7–25)
CALCIUM: 9.9 mg/dL (ref 8.6–10.3)
CO2: 27 mmol/L (ref 20–31)
Chloride: 98 mmol/L (ref 98–110)
Creat: 0.8 mg/dL (ref 0.70–1.33)
GLUCOSE: 111 mg/dL — AB (ref 65–99)
POTASSIUM: 4.3 mmol/L (ref 3.5–5.3)
SODIUM: 137 mmol/L (ref 135–146)

## 2015-12-28 LAB — HEPATIC FUNCTION PANEL
ALT: 23 U/L (ref 9–46)
AST: 18 U/L (ref 10–35)
Albumin: 4.8 g/dL (ref 3.6–5.1)
Alkaline Phosphatase: 45 U/L (ref 40–115)
BILIRUBIN DIRECT: 0.1 mg/dL (ref <=0.2)
BILIRUBIN INDIRECT: 0.6 mg/dL (ref 0.2–1.2)
BILIRUBIN TOTAL: 0.7 mg/dL (ref 0.2–1.2)
Total Protein: 7.6 g/dL (ref 6.1–8.1)

## 2015-12-28 LAB — HEMOGLOBIN A1C
Hgb A1c MFr Bld: 5.6 % (ref <5.7)
MEAN PLASMA GLUCOSE: 114 mg/dL

## 2015-12-29 LAB — MICROALBUMIN / CREATININE URINE RATIO
Creatinine, Urine: 31 mg/dL (ref 20–370)
Microalb, Ur: <0.2 mg/dL

## 2016-02-22 ENCOUNTER — Encounter: Payer: Self-pay | Admitting: Family Medicine

## 2016-02-22 ENCOUNTER — Ambulatory Visit (INDEPENDENT_AMBULATORY_CARE_PROVIDER_SITE_OTHER): Payer: BLUE CROSS/BLUE SHIELD | Admitting: Family Medicine

## 2016-02-22 VITALS — BP 144/90 | HR 79 | Temp 97.6°F | Ht 64.25 in | Wt 199.8 lb

## 2016-02-22 DIAGNOSIS — R29898 Other symptoms and signs involving the musculoskeletal system: Secondary | ICD-10-CM | POA: Diagnosis not present

## 2016-02-22 DIAGNOSIS — M791 Myalgia, unspecified site: Secondary | ICD-10-CM

## 2016-02-22 MED ORDER — TRAZODONE HCL 50 MG PO TABS: 25.0000 mg | ORAL_TABLET | Freq: Every evening | ORAL | 2 refills | Status: DC | PRN

## 2016-02-22 MED ORDER — TESTOSTERONE 20.25 MG/ACT (1.62%) TD GEL: TRANSDERMAL | 2 refills | Status: DC

## 2016-02-22 NOTE — Patient Instructions (Signed)
Rhabdomyolysis °Rhabdomyolysis is a condition that happens when muscle cells break down and release substances into the blood that can damage the kidneys. This condition happens because of damage to the muscles that move bones (skeletal muscle). When the skeletal muscles are damaged, substances inside the muscle cells go into the blood. One of these substances is a protein called myoglobin. °Large amounts of myoglobin can cause kidney damage or kidney failure. Other substances that are released by muscle cells may upset the balance of the minerals (electrolytes) in your blood. This imbalance causes your blood to have too much acid (acidosis). °What are the causes? °This condition is caused by muscle damage. Muscle damage often happens because of: °· Using your muscles too much. °· An injury that crushes or squeezes a muscle too tightly. °· Using illegal drugs, mainly cocaine. °· Alcohol abuse. °Other possible causes include: °· Prescription medicines, such as those that: °¨ Lower cholesterol (statins). °¨ Treat ADHD (attention deficit hyperactivity disorder) or help with weight loss (amphetamines). °¨ Treat pain (opiates). °· Infections. °· Muscle diseases that are passed down from parent to child (inherited). °· High fever. °· Heatstroke. °· Not having enough fluids in your body (dehydration). °· Seizures. °· Surgery. °What increases the risk? °This condition is more likely to develop in people who: °· Have a family history of muscle disease. °· Take part in extreme sports, such as running in marathons. °· Have diabetes. °· Are older. °· Abuse drugs or alcohol. °What are the signs or symptoms? °Symptoms of this condition vary. Some people have very few symptoms, and other people have many symptoms. The most common symptoms include: °· Muscle pain and swelling. °· Weak muscles. °· Dark urine. °· Feeling weak and tired. °Other symptoms include: °· Nausea and vomiting. °· Fever. °· Pain in the abdomen. °· Pain in the  joints. °Symptoms of complications from this condition include: °· Heart rhythm that is not normal (arrhythmia). °· Seizures. °· Not urinating enough because of kidney failure. °· Very low blood pressure (shock). Signs of shock include dizziness, blurry vision, and clammy skin. °· Bleeding that is hard to stop or control. °How is this diagnosed? °This condition is diagnosed based on your medical history, your symptoms, and a physical exam. Tests may also be done, including: °· Blood tests. °· Urine tests to check for myoglobin. °You may also have other tests to check for causes of muscle damage and to check for complications. °How is this treated? °Treatment for this condition helps to: °· Make sure you have enough fluids in your body. °· Lower the acid levels in your blood to reverse acidosis. °· Protect your kidneys. °Treatment may include: °· Fluids and medicines given through an IV tube that is inserted into one of your veins. °· Medicines to lower acidosis or to bring back the balance of the minerals in your body. °· Hemodialysis. This treatment uses an artificial kidney machine to filter your blood while you recover. You may have this if other treatments are not helping. °Follow these instructions at home: °· Take over-the-counter and prescription medicines only as told by your health care provider. °· Rest at home until your health care provider says that you can return to your normal activities. °· Drink enough fluid to keep your urine clear or pale yellow. °· Do not do activities that take a lot of effort (are strenuous). Ask your health care provider what level of exercise is safe for you. °· Do not abuse drugs or alcohol.   If you are having problems with drug or alcohol use, ask your health care provider for help. °· Keep all follow-up visits as told by your health care provider. This is important. °Contact a health care provider if: °· You start having symptoms of this condition after treatment. °Get help  right away if: °· You have a seizure. °· You bleed easily or cannot control bleeding. °· You cannot urinate. °· You have chest pain. °· You have trouble breathing. °This information is not intended to replace advice given to you by your health care provider. Make sure you discuss any questions you have with your health care provider. °Document Released: 01/18/2004 Document Revised: 11/17/2015 Document Reviewed: 11/17/2015 °Elsevier Interactive Patient Education © 2017 [employer] ° °

## 2016-02-22 NOTE — Progress Notes (Signed)
Pre visit review using our clinic review tool, if applicable. No additional management support is needed unless otherwise documented below in the visit note. 

## 2016-02-22 NOTE — Progress Notes (Signed)
Dr. Frederico Hamman T. Markeith Jue, MD, [facility] [place], [address] Phone: [phone] Fax: [phone]  02/22/2016  Patient: Allen Howe, MRN: [id number], DOB: [birth year], 56 y.o.  Primary Physician:  Allen Loffler, MD   Chief Complaint  Patient presents with  . Leg Pain    Bilateral-right is worse than the left   Subjective:   Allen Howe is a 57 y.o. very pleasant male patient who presents with the following:  56 yo male who has been exercising more and getting into shape went to the gym with his son who was home from college.  Bike (10 miles or more), treadmill (2 miles), additional 20 miles on the bike + more work in the gym - has been really bad pain and pain into the R leg. This was 2 weeks ago. He had global muscle pain that was severe in character, and he is now still having problems sleeping. Pain in the thighs and in the entire LE, but this has been improving. He also felt globally weak and poorly. No associated illness. First MD eval is today.   R > L leg.   NSAIDS Probable exercise induced rhabdo  Past Medical History, Surgical History, Social History, Family History, Problem List, Medications, and Allergies have been reviewed and updated if relevant.  Patient Active Problem List   Diagnosis Date Noted  . Diabetes mellitus type 2, uncontrolled, without complications (HCC)     Priority: High  . Special screening for malignant neoplasms, colon 05/23/2015  . Gastroesophageal reflux disease without esophagitis 05/23/2015  . Hyperlipidemia LDL goal <70 01/06/2014  . Obesity (BMI 30-39.9) 02/15/2013  . Hypertension   . Kidney stones   . Basal cell carcinoma   . Gout   . Allergic rhinitis due to pollen   . Hypogonadism male     Past Medical History:  Diagnosis Date  . Allergic rhinitis due to pollen   . Basal cell carcinoma    Basal Cell on neck  . Diabetes type 2, controlled ([place])   .  Diverticulosis   . Fatty liver   . Gout   . Hyperlipidemia LDL goal <70 01/06/2014  . Hypertension   . Hypogonadism male   . Internal hemorrhoids   . Kidney stones     Past Surgical History:  Procedure Laterality Date  . VASECTOMY      Social History   Social History  . Marital status: Legally Separated    Spouse name: N/A  . Number of children: 2  . Years of education: N/A   Occupational History  . accountant    Social History Main Topics  . Smoking status: Never Smoker  . Smokeless tobacco: Former Systems developer    Types: Chew    Quit date: 10/14/2005  . Alcohol use 8.4 oz/week    14 Standard drinks or equivalent per week     Comment: occ  . Drug use: No  . Sexual activity: Not Currently   Other Topics Concern  . Not on file   Social History Narrative   Divorced   Dual custody of children    Family History  Problem Relation Age of Onset  . Hypertension Mother   . Gallbladder disease Mother   . Heart disease Father   . Hypertension Father   . Diabetes Father   . Colon cancer Neg Hx     No Known Allergies  Medication list reviewed and updated in full in [facility]  Link.  GEN: No fevers, chills. Nontoxic. Primarily MSK c/o today. MSK: Detailed in the HPI GI: tolerating PO intake without difficulty Neuro: No numbness, parasthesias, or tingling associated. Otherwise the pertinent positives of the ROS are noted above.   Objective:   BP (!) 144/90   Pulse 79   Temp 97.6 F (36.4 C) (Oral)   Ht 5' 4.25" (1.632 m)   Wt 199 lb 12 oz (90.6 kg)   BMI 34.02 kg/m    GEN: WDWN, NAD, Non-toxic, Alert & Oriented x 3 HEENT: Atraumatic, Normocephalic.  Ears and Nose: No external deformity. CV: RRR, no m/g/r  EXTR: No clubbing/cyanosis/edema NEURO: Normal gait.  PSYCH: Normally interactive. Conversant. Not depressed or anxious appearing.  Calm demeanor.    Mild tenderness only throughout LE and thighs, but no severe tenderness. No pain with stretching of the  compartments in the LE. No bruising.   Radiology: No results found.  Assessment and Plan:   Myalgia - Plan: CK, CBC with Differential/Platelet, Basic metabolic panel, Urinalysis, Routine w reflex microscopic  Weakness of both legs - Plan: CK, CBC with Differential/Platelet, Basic metabolic panel, Urinalysis, Routine w reflex microscopic  Challenging case - my suspicion is that he had exercise induced rhabdo. Check renal function and CK now, but suspect this should have drifted lower at this point. Rx absolute rest, no exercise, water - await labs.   Follow-up: No Follow-up on file.  Meds ordered this encounter  Medications  . Testosterone (ANDROGEL PUMP) 20.25 MG/ACT (1.62%) GEL    Sig: APPLY 2 PUMPS DAILY ONTO CLEAN, DRY SHOULDERS.    Dispense:  75 g    Refill:  2  . traZODone (DESYREL) 50 MG tablet    Sig: Take 0.5-1 tablets (25-50 mg total) by mouth at bedtime as needed for sleep.    Dispense:  30 tablet    Refill:  2   Medications Discontinued During This Encounter  Medication Reason  . Testosterone (ANDROGEL PUMP) 20.25 MG/ACT (1.62%) GEL Reorder   Orders Placed This Encounter  Procedures  . CK  . CBC with Differential/Platelet  . Basic metabolic panel  . Urinalysis, Routine w reflex microscopic    Signed,  Allen Crisanto T. Meyer Arora, MD   Allergies as of 02/22/2016   No Known Allergies     Medication List       Accurate as of 02/22/16  5:19 PM. Always use your most recent med list.          ALLEGRA 180 MG tablet Generic drug:  fexofenadine Take 180 mg by mouth daily.   allopurinol 100 MG tablet Commonly known as:  ZYLOPRIM Take 1 tablet (100 mg total) by mouth daily.   atorvastatin 20 MG tablet Commonly known as:  LIPITOR Take 1 tablet (20 mg total) by mouth daily.   budesonide 32 MCG/ACT nasal spray Commonly known as:  RHINOCORT ALLERGY Place 2 sprays into both nostrils daily.   chlorthalidone 25 MG tablet Commonly known as:  HYGROTON TAKE 1 TABLET  (25 MG TOTAL) BY MOUTH DAILY.   colchicine 0.6 MG tablet Take 1 tablet (0.6 mg total) by mouth daily. Take two tablets at onset on symptoms for gout   metFORMIN 500 MG tablet Commonly known as:  GLUCOPHAGE TAKE 2 TABLETS BY MOUTH 2 TIMES DAILY WITH A MEAL.   omeprazole 40 MG capsule Commonly known as:  PRILOSEC Take 1 capsule (40 mg total) by mouth daily.   sildenafil 20 MG tablet Commonly known as:  REVATIO Take 2  to 5 tablets by mouth 30 minutes prior to intercourse   Testosterone 20.25 MG/ACT (1.62%) Gel Commonly known as:  ANDROGEL PUMP APPLY 2 PUMPS DAILY ONTO CLEAN, DRY SHOULDERS.   traZODone 50 MG tablet Commonly known as:  DESYREL Take 0.5-1 tablets (25-50 mg total) by mouth at bedtime as needed for sleep.   valsartan 80 MG tablet Commonly known as:  DIOVAN TAKE 1 TABLET (80 MG TOTAL) BY MOUTH DAILY.   Vitamin D3 2000 units Tabs Take 1 tablet by mouth daily. Reported on 07/05/2015

## 2016-02-23 LAB — BASIC METABOLIC PANEL
BUN: 20 mg/dL (ref 6–23)
CALCIUM: 10.2 mg/dL (ref 8.4–10.5)
CO2: 31 mEq/L (ref 19–32)
Chloride: 98 mEq/L (ref 96–112)
Creatinine, Ser: 0.87 mg/dL (ref 0.40–1.50)
GFR: 96.18 mL/min (ref >60.00)
Glucose, Bld: 102 mg/dL — ABNORMAL HIGH (ref 70–99)
Potassium: 4.2 mEq/L (ref 3.5–5.1)
SODIUM: 137 meq/L (ref 135–145)

## 2016-02-23 LAB — URINALYSIS, ROUTINE W REFLEX MICROSCOPIC
BILIRUBIN URINE: NEGATIVE
HGB URINE DIPSTICK: NEGATIVE
Ketones, ur: NEGATIVE
LEUKOCYTES UA: NEGATIVE
Nitrite: NEGATIVE
PH: 6 (ref 5.0–8.0)
Specific Gravity, Urine: 1.015 (ref 1.000–1.030)
TOTAL PROTEIN, URINE-UPE24: NEGATIVE
URINE GLUCOSE: NEGATIVE
UROBILINOGEN UA: 0.2 (ref 0.0–1.0)

## 2016-02-23 LAB — CBC WITH DIFFERENTIAL/PLATELET
Basophils Absolute: 0.1 10*3/uL (ref 0.0–0.1)
Basophils Relative: 0.6 % (ref 0.0–3.0)
EOS PCT: 2.9 % (ref 0.0–5.0)
Eosinophils Absolute: 0.3 10*3/uL (ref 0.0–0.7)
HCT: 41 % (ref 39.0–52.0)
Hemoglobin: 14.1 g/dL (ref 13.0–17.0)
LYMPHS ABS: 3.1 10*3/uL (ref 0.7–4.0)
Lymphocytes Relative: 33.2 % (ref 12.0–46.0)
MCHC: 34.3 g/dL (ref 30.0–36.0)
MCV: 93.1 fl (ref 78.0–100.0)
MONO ABS: 0.7 10*3/uL (ref 0.1–1.0)
Monocytes Relative: 7.5 % (ref 3.0–12.0)
NEUTROS ABS: 5.3 10*3/uL (ref 1.4–7.7)
NEUTROS PCT: 55.8 % (ref 43.0–77.0)
PLATELETS: 273 10*3/uL (ref 150.0–400.0)
RBC: 4.4 Mil/uL (ref 4.22–5.81)
RDW: 13.6 % (ref 11.5–15.5)
WBC: 9.5 10*3/uL (ref 4.0–10.5)

## 2016-02-23 LAB — CK: CK TOTAL: 146 U/L (ref 7–232)

## 2016-08-03 ENCOUNTER — Other Ambulatory Visit: Payer: Self-pay | Admitting: Family Medicine

## 2016-08-08 ENCOUNTER — Other Ambulatory Visit: Payer: Self-pay | Admitting: *Deleted

## 2016-08-08 MED ORDER — METFORMIN HCL 500 MG PO TABS: ORAL_TABLET | ORAL | 0 refills | Status: DC

## 2016-08-08 MED ORDER — TRAZODONE HCL 50 MG PO TABS: 25.0000 mg | ORAL_TABLET | Freq: Every evening | ORAL | 0 refills | Status: DC | PRN

## 2016-09-06 ENCOUNTER — Telehealth: Payer: Self-pay

## 2016-09-06 DIAGNOSIS — I10 Essential (primary) hypertension: Secondary | ICD-10-CM

## 2016-09-06 MED ORDER — LOSARTAN POTASSIUM 50 MG PO TABS: 50.0000 mg | ORAL_TABLET | Freq: Every day | ORAL | 0 refills | Status: DC

## 2016-09-06 NOTE — Telephone Encounter (Signed)
Looks like his BP has been above goal on his last several office visits. Given history of diabetes he will need the ARB. Rx for Losartan 50 mg tablets sent to pharmacy. Please have him monitor his BP daily for the next 2 weeks, record readings and bring them to his next appointment. Needs BP follow up visit with PCP in 2 weeks.

## 2016-09-06 NOTE — Telephone Encounter (Signed)
Pt left v/m requesting new med to take place of valsartan since valsartan has been recalled. Last annual exam 12/27/2015. DrCopland out of office for a wk.Please advise. [place].

## 2016-09-09 NOTE — Telephone Encounter (Signed)
Patient advised.   Patient states he does not have a BP monitor but will see about getting one and will make a follow up appointment when he can.

## 2016-10-22 NOTE — Telephone Encounter (Signed)
Pt left v/m; pt does not want to come in for f/u until CPX which is due after 12/27/15. Pt wants to gets refill for losartan until comes in for CPX. Pt request cb. [facility] [place] . Pt does not have any future appts scheduled.Please advise.

## 2016-10-22 NOTE — Addendum Note (Signed)
Addended by: Helene Shoe on: 10/22/2016 04:32 PM   Modules accepted: Orders

## 2016-10-23 MED ORDER — LOSARTAN POTASSIUM 50 MG PO TABS: 50.0000 mg | ORAL_TABLET | Freq: Every day | ORAL | 0 refills | Status: DC

## 2016-10-23 NOTE — Telephone Encounter (Signed)
Ok to refill losartan 50 mg, 1 po daily, 90, 0 ref

## 2016-10-23 NOTE — Telephone Encounter (Addendum)
Losartan sent to [facility] at [facility] as instructed by Dr. Lorelei Pont.  Mr. Foss notified by telephone and advised to call and schedule his CPE for sometime after 12/26/16.

## 2016-10-23 NOTE — Addendum Note (Signed)
Addended by: Carter Kitten on: 10/23/2016 10:10 AM   Modules accepted: Orders

## 2017-01-14 ENCOUNTER — Telehealth: Payer: Self-pay | Admitting: *Deleted

## 2017-01-14 DIAGNOSIS — E291 Testicular hypofunction: Secondary | ICD-10-CM

## 2017-01-14 DIAGNOSIS — E1165 Type 2 diabetes mellitus with hyperglycemia: Principal | ICD-10-CM

## 2017-01-14 DIAGNOSIS — E559 Vitamin D deficiency, unspecified: Secondary | ICD-10-CM

## 2017-01-14 DIAGNOSIS — I10 Essential (primary) hypertension: Secondary | ICD-10-CM

## 2017-01-14 DIAGNOSIS — IMO0001 Reserved for inherently not codable concepts without codable children: Secondary | ICD-10-CM

## 2017-01-14 DIAGNOSIS — Z125 Encounter for screening for malignant neoplasm of prostate: Secondary | ICD-10-CM

## 2017-01-14 DIAGNOSIS — M109 Gout, unspecified: Secondary | ICD-10-CM

## 2017-01-14 DIAGNOSIS — E785 Hyperlipidemia, unspecified: Secondary | ICD-10-CM

## 2017-01-14 NOTE — Telephone Encounter (Signed)
Copied from [place] [id number]. 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 Would he need an order in hand or could it be put in the system?  >> Jan 14, 2017  4:11 PM Modena Nunnery, [place] wrote: CPE scheduled for 12/27

## 2017-01-14 NOTE — Telephone Encounter (Signed)
Will forward to Dr. Lorelei Pont to enter future orders in Epic.

## 2017-01-15 NOTE — Telephone Encounter (Signed)
Lab orders placed, he can go to [facility] [place] site to have this done. Patient notified

## 2017-01-15 NOTE — Telephone Encounter (Signed)
Terri, can you help me do this for him? He wants to have drawn at a different office?   FLP, E78.5 A1c, E11.9 Urine microalbumin, E11.9 Cbc with diff, HFP, BMET: Z79.899 PSA: Z12.5 Total and Free testosterone: E29.1  Vit D: E55.9  Uric acid: gout

## 2017-01-31 ENCOUNTER — Other Ambulatory Visit: Payer: Self-pay | Admitting: Family Medicine

## 2017-01-31 DIAGNOSIS — K219 Gastro-esophageal reflux disease without esophagitis: Secondary | ICD-10-CM

## 2017-01-31 DIAGNOSIS — I10 Essential (primary) hypertension: Secondary | ICD-10-CM

## 2017-02-13 ENCOUNTER — Encounter: Payer: BLUE CROSS/BLUE SHIELD | Admitting: Family Medicine

## 2017-02-21 ENCOUNTER — Other Ambulatory Visit (INDEPENDENT_AMBULATORY_CARE_PROVIDER_SITE_OTHER): Payer: BLUE CROSS/BLUE SHIELD

## 2017-02-21 DIAGNOSIS — E785 Hyperlipidemia, unspecified: Secondary | ICD-10-CM

## 2017-02-21 DIAGNOSIS — I10 Essential (primary) hypertension: Secondary | ICD-10-CM | POA: Diagnosis not present

## 2017-02-21 DIAGNOSIS — E291 Testicular hypofunction: Secondary | ICD-10-CM | POA: Diagnosis not present

## 2017-02-21 DIAGNOSIS — Z125 Encounter for screening for malignant neoplasm of prostate: Secondary | ICD-10-CM | POA: Diagnosis not present

## 2017-02-21 DIAGNOSIS — E1165 Type 2 diabetes mellitus with hyperglycemia: Secondary | ICD-10-CM | POA: Diagnosis not present

## 2017-02-21 DIAGNOSIS — M109 Gout, unspecified: Secondary | ICD-10-CM

## 2017-02-21 DIAGNOSIS — E559 Vitamin D deficiency, unspecified: Secondary | ICD-10-CM | POA: Diagnosis not present

## 2017-02-21 DIAGNOSIS — IMO0001 Reserved for inherently not codable concepts without codable children: Secondary | ICD-10-CM

## 2017-02-21 LAB — CBC WITH DIFFERENTIAL/PLATELET
BASOS PCT: 1 % (ref 0.0–3.0)
Basophils Absolute: 0.1 10*3/uL (ref 0.0–0.1)
EOS PCT: 1.6 % (ref 0.0–5.0)
Eosinophils Absolute: 0.1 10*3/uL (ref 0.0–0.7)
HCT: 41 % (ref 39.0–52.0)
Hemoglobin: 14.1 g/dL (ref 13.0–17.0)
LYMPHS ABS: 2.1 10*3/uL (ref 0.7–4.0)
Lymphocytes Relative: 28.4 % (ref 12.0–46.0)
MCHC: 34.5 g/dL (ref 30.0–36.0)
MCV: 96.4 fl (ref 78.0–100.0)
MONOS PCT: 9.4 % (ref 3.0–12.0)
Monocytes Absolute: 0.7 10*3/uL (ref 0.1–1.0)
NEUTROS ABS: 4.5 10*3/uL (ref 1.4–7.7)
NEUTROS PCT: 59.6 % (ref 43.0–77.0)
Platelets: 243 10*3/uL (ref 150.0–400.0)
RBC: 4.25 Mil/uL (ref 4.22–5.81)
RDW: 13.3 % (ref 11.5–15.5)
WBC: 7.6 10*3/uL (ref 4.0–10.5)

## 2017-02-21 LAB — HEPATIC FUNCTION PANEL
ALT: 38 U/L (ref 0–53)
AST: 27 U/L (ref 0–37)
Albumin: 4.7 g/dL (ref 3.5–5.2)
Alkaline Phosphatase: 55 U/L (ref 39–117)
BILIRUBIN DIRECT: 0.2 mg/dL (ref 0.0–0.3)
BILIRUBIN TOTAL: 1 mg/dL (ref 0.2–1.2)
Total Protein: 7.5 g/dL (ref 6.0–8.3)

## 2017-02-21 LAB — VITAMIN D 25 HYDROXY (VIT D DEFICIENCY, FRACTURES): VITD: 54.33 ng/mL (ref 30.00–100.00)

## 2017-02-21 LAB — LIPID PANEL
CHOLESTEROL: 191 mg/dL (ref 0–200)
HDL: 59 mg/dL (ref >39.00)
LDL Cholesterol: 93 mg/dL (ref 0–99)
NONHDL: 132.31
Total CHOL/HDL Ratio: 3
Triglycerides: 196 mg/dL — ABNORMAL HIGH (ref 0.0–149.0)
VLDL: 39.2 mg/dL (ref 0.0–40.0)

## 2017-02-21 LAB — PSA: PSA: 5.05 ng/mL — AB (ref 0.10–4.00)

## 2017-02-21 LAB — URIC ACID: URIC ACID, SERUM: 8.4 mg/dL — AB (ref 4.0–7.8)

## 2017-02-21 LAB — BASIC METABOLIC PANEL
BUN: 25 mg/dL — ABNORMAL HIGH (ref 6–23)
CHLORIDE: 95 meq/L — AB (ref 96–112)
CO2: 27 meq/L (ref 19–32)
Calcium: 9.6 mg/dL (ref 8.4–10.5)
Creatinine, Ser: 0.89 mg/dL (ref 0.40–1.50)
GFR: 93.36 mL/min (ref >60.00)
GLUCOSE: 133 mg/dL — AB (ref 70–99)
POTASSIUM: 3.8 meq/L (ref 3.5–5.1)
Sodium: 137 mEq/L (ref 135–145)

## 2017-02-21 LAB — MICROALBUMIN / CREATININE URINE RATIO
Creatinine,U: 74.7 mg/dL
Microalb Creat Ratio: 2.6 mg/g (ref 0.0–30.0)
Microalb, Ur: 1.9 mg/dL (ref 0.0–1.9)

## 2017-02-21 LAB — HEMOGLOBIN A1C: HEMOGLOBIN A1C: 6.8 % — AB (ref 4.6–6.5)

## 2017-02-26 LAB — TESTOS,TOTAL,FREE AND SHBG (FEMALE)
FREE TESTOSTERONE: 47.6 pg/mL (ref 35.0–155.0)
SEX HORMONE BINDING: 17 nmol/L — AB (ref 22–77)
TESTOSTERONE, TOTAL, LC-MS-MS: 266 ng/dL (ref 250–1100)

## 2017-03-03 ENCOUNTER — Encounter: Payer: Self-pay | Admitting: Family Medicine

## 2017-03-03 ENCOUNTER — Ambulatory Visit (INDEPENDENT_AMBULATORY_CARE_PROVIDER_SITE_OTHER): Payer: BLUE CROSS/BLUE SHIELD | Admitting: Family Medicine

## 2017-03-03 ENCOUNTER — Other Ambulatory Visit: Payer: Self-pay

## 2017-03-03 VITALS — BP 144/100 | HR 94 | Temp 97.6°F | Ht 64.5 in | Wt 210.0 lb

## 2017-03-03 DIAGNOSIS — Z0000 Encounter for general adult medical examination without abnormal findings: Secondary | ICD-10-CM | POA: Diagnosis not present

## 2017-03-03 DIAGNOSIS — E291 Testicular hypofunction: Secondary | ICD-10-CM

## 2017-03-03 DIAGNOSIS — Z23 Encounter for immunization: Secondary | ICD-10-CM | POA: Diagnosis not present

## 2017-03-03 MED ORDER — ESCITALOPRAM OXALATE 10 MG PO TABS: 10.0000 mg | ORAL_TABLET | Freq: Every day | ORAL | 3 refills | Status: DC

## 2017-03-03 MED ORDER — SILDENAFIL CITRATE 20 MG PO TABS: ORAL_TABLET | ORAL | 11 refills | Status: DC
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
## 2017-03-03 NOTE — Patient Instructions (Signed)

## 2017-03-04 ENCOUNTER — Encounter: Payer: Self-pay | Admitting: Family Medicine

## 2017-03-27 DIAGNOSIS — R972 Elevated prostate specific antigen [PSA]: Secondary | ICD-10-CM | POA: Diagnosis not present

## 2017-03-27 DIAGNOSIS — E291 Testicular hypofunction: Secondary | ICD-10-CM | POA: Diagnosis not present

## 2017-03-27 DIAGNOSIS — N529 Male erectile dysfunction, unspecified: Secondary | ICD-10-CM | POA: Diagnosis not present

## 2017-04-01 ENCOUNTER — Telehealth: Payer: Self-pay | Admitting: Family Medicine

## 2017-04-01 NOTE — Telephone Encounter (Signed)
Copied from [place] [id number] [id number]. Topic: Quick Communication - Rx Refill/Question >> Apr 01, 2017 12:36 PM Scherrie Gerlach wrote: Medication  Testosterone (ANDROGEL PUMP) 20.25 MG/ACT (1.62%) GEL   Has the patient contacted their pharmacy? {yes  But pt has moved and changed pharmacy.  They do not have there. No refills on the other  [place], [place] [phone] (Phone) [phone] (Fax)

## 2017-04-01 NOTE — Telephone Encounter (Signed)
Last office visit 03/03/17 Last refill 02/21/17  75 G/2 refills

## 2017-04-02 NOTE — Telephone Encounter (Signed)
Denied. Entirely inappropriate. Currently ongoing urology work-up with elevated PSA.

## 2017-04-14 ENCOUNTER — Other Ambulatory Visit: Payer: Self-pay | Admitting: Family Medicine

## 2017-04-14 NOTE — Telephone Encounter (Signed)
LOV: 03/03/17 PCP: [place]: [place], [place] [phone] (Phone) [phone] (Fax).

## 2017-04-14 NOTE — Telephone Encounter (Signed)
Copied from [place] [phone]. Topic: Inquiry >> Apr 14, 2017 10:35 AM Pricilla Handler wrote: Reason for CRM: Patient called requesting a refill of Testosterone (ANDROGEL PUMP) 20.25 MG/ACT (1.62%) GEL. Patient states that Dr. Lovena Neighbours has requested that patient uses this medication. Patient's preferred pharmacy is [facility] [employer] [place] [employer], [place] [phone] (Phone) [phone] (Fax).       Thank You!!!

## 2017-04-15 NOTE — Telephone Encounter (Signed)
Pt calling about his med request, please call back with update cb is [phone]

## 2017-04-15 NOTE — Telephone Encounter (Signed)
Left message for Allen Howe that I spoke with Urology office.  He PSA was normal at 1.99 so they cancelled his biopsy.  He is on a recall list to have his PSA recheck again in May.   No testosterone treatment until repeat PSA in May.  I advised that if he had any questions to feel free to contact Dr. Gilford Rile office.

## 2017-04-15 NOTE — Telephone Encounter (Addendum)
Mr. Harriott notified that Dr. Lorelei Pont denied his testosterone refill due to ongoing urology work up for elevated PSA.  He states he saw Dr. Gilford Rile at [facility] and his PSA was back to normal range.  They advised that he can continue using the Testosterone Gel daily and to follow up with them in 3 months.  Office note from [facility] is scanned in chart but no PSA results.  I called Dr. Gilford Rile office and according to what they told me is he is PSA returned in normal range but he is on a 3 month recall to recheck PSA again  before deciding on Testosterone therapy.  I ask that they fax his PSA results to Dr. Lorelei Pont.  I also left message with Dr. Gilford Rile nurse to either call me back to clarify patient instructions or call patient and let him know that he is not suppose to use the testosterone until his PSA is rechecked in 3 months.

## 2017-04-16 NOTE — Telephone Encounter (Signed)
I appreciate Allen Howe's diligence in finding the truth in this matter. No testosterone until cleared by Urology.

## 2017-04-23 ENCOUNTER — Encounter: Payer: Self-pay | Admitting: Family Medicine

## 2017-04-23 ENCOUNTER — Ambulatory Visit: Payer: BLUE CROSS/BLUE SHIELD | Admitting: Family Medicine

## 2017-04-23 ENCOUNTER — Other Ambulatory Visit: Payer: Self-pay

## 2017-04-23 VITALS — BP 180/96 | HR 105 | Temp 97.8°F | Ht 64.5 in | Wt 212.0 lb

## 2017-04-23 DIAGNOSIS — I10 Essential (primary) hypertension: Secondary | ICD-10-CM | POA: Diagnosis not present

## 2017-04-23 DIAGNOSIS — F1010 Alcohol abuse, uncomplicated: Secondary | ICD-10-CM | POA: Diagnosis not present

## 2017-04-23 DIAGNOSIS — F321 Major depressive disorder, single episode, moderate: Secondary | ICD-10-CM

## 2017-04-23 HISTORY — DX: Alcohol abuse, uncomplicated: F10.10

## 2017-04-23 MED ORDER — AMLODIPINE BESYLATE 10 MG PO TABS: 10.0000 mg | ORAL_TABLET | Freq: Every day | ORAL | 3 refills | Status: DC
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
## 2017-05-05 ENCOUNTER — Other Ambulatory Visit: Payer: Self-pay | Admitting: Family Medicine

## 2017-05-05 DIAGNOSIS — K219 Gastro-esophageal reflux disease without esophagitis: Secondary | ICD-10-CM

## 2017-06-19 DIAGNOSIS — R972 Elevated prostate specific antigen [PSA]: Secondary | ICD-10-CM | POA: Diagnosis not present

## 2017-06-21 ENCOUNTER — Encounter: Payer: Self-pay | Admitting: Family Medicine

## 2017-06-23 NOTE — Telephone Encounter (Signed)
Can you call?  Swelling most likely coming from amlodipine - go ahead and stop this.   Has he ever taken ACE-inhibitor class of medication like lisinopril or ramipril? (No since he has been coming to our office). This is loosely related to valsartan that he used to take?

## 2017-06-24 ENCOUNTER — Other Ambulatory Visit: Payer: Self-pay | Admitting: Family Medicine

## 2017-06-24 MED ORDER — LISINOPRIL 20 MG PO TABS: 20.0000 mg | ORAL_TABLET | Freq: Every day | ORAL | 3 refills | Status: DC

## 2017-06-24 MED ORDER — TESTOSTERONE 20.25 MG/ACT (1.62%) TD GEL: TRANSDERMAL | 2 refills | Status: DC

## 2017-06-24 NOTE — Telephone Encounter (Addendum)
Received PSA results from [facility] from 06/19/2017.   PSA 1.28 ng/ml,  Free PSA 0.31 ng/ml and %Free PSA 24%.

## 2017-06-24 NOTE — Progress Notes (Signed)
done

## 2017-07-07 ENCOUNTER — Other Ambulatory Visit: Payer: Self-pay | Admitting: Family Medicine

## 2017-07-07 DIAGNOSIS — I10 Essential (primary) hypertension: Secondary | ICD-10-CM

## 2017-07-07 NOTE — Telephone Encounter (Signed)
Last office visit 04/23/2017.  AVS states to follow up in 6 weeks.  No future appointments.  Refill?

## 2017-07-29 ENCOUNTER — Other Ambulatory Visit: Payer: Self-pay | Admitting: Family Medicine

## 2017-07-29 DIAGNOSIS — K219 Gastro-esophageal reflux disease without esophagitis: Secondary | ICD-10-CM

## 2017-10-16 ENCOUNTER — Telehealth: Payer: Self-pay | Admitting: Family Medicine

## 2017-10-16 NOTE — Telephone Encounter (Signed)
06/24/2017 note: He really needs to make an 8 AM appt. This is not urgent. Sometime in the next 6-8 weeks is fine.  "I got those lab values this morning, and I sent in your androgel. I also sent you in some lisinopril (BP medicine) which should be roughly equal to the valsartan dose that you used to take. In another 2 months or so, we will need to check an 8 AM - 9 AM testosterone level along with some other labs. The easiest thing would be to make an 8 AM appt, we can check BP, and make any adjustments."

## 2017-10-16 NOTE — Telephone Encounter (Signed)
Last office visit 04/23/2017 for HTN.  AVS states to return in about 4 weeks.  No future appointments.  Last refilled  Lexapro 07/07/2017 for #30 with 2 refills.  Testosterone 06/24/2017 for 75 g with 2 refills.  OK to refill?

## 2017-10-16 NOTE — Telephone Encounter (Signed)
Spoke with pt to schedule appointment.  Pt stated he would like dr copland to call him.  He is wanting to transfer to [place] location. He is 2 miles from that office and would like to talk dr copland prior to transferring Pt phone # [phone]

## 2017-10-16 NOTE — Telephone Encounter (Signed)
Robin,  Please schedule Allen Howe a 8:00 am appt with Dr. Lorelei Pont in 2 months so we can check his blood pressure and do labs on the same day.

## 2017-10-16 NOTE — Telephone Encounter (Signed)
I talked to Allen Howe. He lives by [facility], so he wants to start going there, but he wanted to let all of [place] know how much he appreciated our care over the years.

## 2017-10-22 NOTE — Telephone Encounter (Signed)
I have never done this in 15 years. I can't imagine Dr. Jerline Pain would mind ordering his labs prior to his office visit. It is reasonably difficult for me to order labs at the [place] office.   I will send this note to Dr. Jerline Pain. If there is an issue, then let me know.   He is due for:  Fasting labs drawn between 8 - 10 AM  Testosterone, free and total PSA, free and total CBC with diff Hgba1c  Thank-you.

## 2017-10-22 NOTE — Telephone Encounter (Signed)
Pt called to schedule TOC to Dr. Jerline Pain but is asking if Dr. Lorelei Pont will order the labs that he thinks need to be done for pt to have drawn at the [facility] office next week (before he sees Dr. Jerline Pain). Please advise.

## 2017-10-23 ENCOUNTER — Other Ambulatory Visit: Payer: Self-pay

## 2017-10-23 DIAGNOSIS — I10 Essential (primary) hypertension: Secondary | ICD-10-CM

## 2017-10-23 DIAGNOSIS — IMO0001 Reserved for inherently not codable concepts without codable children: Secondary | ICD-10-CM

## 2017-10-23 DIAGNOSIS — Z125 Encounter for screening for malignant neoplasm of prostate: Secondary | ICD-10-CM

## 2017-10-23 DIAGNOSIS — E1165 Type 2 diabetes mellitus with hyperglycemia: Principal | ICD-10-CM

## 2017-10-23 DIAGNOSIS — E291 Testicular hypofunction: Secondary | ICD-10-CM

## 2017-10-23 NOTE — Telephone Encounter (Signed)
[place] with future orders. Please place and have patient come in for lab appointment 1-2 days before his first visit with me  [name]. Jerline Pain, MD 10/23/2017 12:34 PM

## 2017-10-23 NOTE — Telephone Encounter (Signed)
Orders have been placed.

## 2017-10-27 NOTE — Telephone Encounter (Signed)
Lab appointment has been made.

## 2017-10-29 ENCOUNTER — Other Ambulatory Visit (INDEPENDENT_AMBULATORY_CARE_PROVIDER_SITE_OTHER): Payer: BLUE CROSS/BLUE SHIELD

## 2017-10-29 DIAGNOSIS — IMO0001 Reserved for inherently not codable concepts without codable children: Secondary | ICD-10-CM

## 2017-10-29 DIAGNOSIS — Z125 Encounter for screening for malignant neoplasm of prostate: Secondary | ICD-10-CM | POA: Diagnosis not present

## 2017-10-29 DIAGNOSIS — E291 Testicular hypofunction: Secondary | ICD-10-CM | POA: Diagnosis not present

## 2017-10-29 DIAGNOSIS — I10 Essential (primary) hypertension: Secondary | ICD-10-CM | POA: Diagnosis not present

## 2017-10-29 DIAGNOSIS — E1165 Type 2 diabetes mellitus with hyperglycemia: Secondary | ICD-10-CM | POA: Diagnosis not present

## 2017-10-29 LAB — CBC WITH DIFFERENTIAL/PLATELET
BASOS ABS: 0.1 10*3/uL (ref 0.0–0.1)
BASOS PCT: 1 % (ref 0.0–3.0)
EOS PCT: 3 % (ref 0.0–5.0)
Eosinophils Absolute: 0.2 10*3/uL (ref 0.0–0.7)
HEMATOCRIT: 37.8 % — AB (ref 39.0–52.0)
HEMOGLOBIN: 13.1 g/dL (ref 13.0–17.0)
Lymphocytes Relative: 29.7 % (ref 12.0–46.0)
Lymphs Abs: 1.9 10*3/uL (ref 0.7–4.0)
MCHC: 34.7 g/dL (ref 30.0–36.0)
MCV: 95.8 fl (ref 78.0–100.0)
MONO ABS: 0.5 10*3/uL (ref 0.1–1.0)
MONOS PCT: 8.4 % (ref 3.0–12.0)
Neutro Abs: 3.6 10*3/uL (ref 1.4–7.7)
Neutrophils Relative %: 57.9 % (ref 43.0–77.0)
Platelets: 265 10*3/uL (ref 150.0–400.0)
RBC: 3.94 Mil/uL — ABNORMAL LOW (ref 4.22–5.81)
RDW: 13.4 % (ref 11.5–15.5)
WBC: 6.3 10*3/uL (ref 4.0–10.5)

## 2017-10-29 LAB — HEMOGLOBIN A1C: Hgb A1c MFr Bld: 6.9 % — ABNORMAL HIGH (ref 4.6–6.5)

## 2017-11-02 LAB — PSA, TOTAL AND FREE
PSA, % FREE: 24 % — AB (ref >25)
PSA, Free: 0.4 ng/mL
PSA, Total: 1.7 ng/mL (ref <=4.0)

## 2017-11-02 LAB — TESTOSTERONE, FREE & TOTAL
FREE TESTOSTERONE: 62.9 pg/mL (ref 35.0–155.0)
Testosterone, Total, LC-MS-MS: 331 ng/dL (ref 250–1100)

## 2017-11-12 ENCOUNTER — Encounter: Payer: Self-pay | Admitting: Family Medicine

## 2017-11-12 ENCOUNTER — Ambulatory Visit: Payer: BLUE CROSS/BLUE SHIELD | Admitting: Family Medicine

## 2017-11-12 VITALS — BP 136/84 | HR 106 | Temp 97.7°F | Ht 65.0 in | Wt 216.6 lb

## 2017-11-12 DIAGNOSIS — E1169 Type 2 diabetes mellitus with other specified complication: Secondary | ICD-10-CM

## 2017-11-12 DIAGNOSIS — E291 Testicular hypofunction: Secondary | ICD-10-CM | POA: Diagnosis not present

## 2017-11-12 DIAGNOSIS — Z23 Encounter for immunization: Secondary | ICD-10-CM

## 2017-11-12 DIAGNOSIS — E1159 Type 2 diabetes mellitus with other circulatory complications: Secondary | ICD-10-CM

## 2017-11-12 DIAGNOSIS — I10 Essential (primary) hypertension: Secondary | ICD-10-CM

## 2017-11-12 DIAGNOSIS — IMO0001 Reserved for inherently not codable concepts without codable children: Secondary | ICD-10-CM

## 2017-11-12 DIAGNOSIS — E1165 Type 2 diabetes mellitus with hyperglycemia: Secondary | ICD-10-CM | POA: Diagnosis not present

## 2017-11-12 DIAGNOSIS — E785 Hyperlipidemia, unspecified: Secondary | ICD-10-CM

## 2017-11-12 DIAGNOSIS — M109 Gout, unspecified: Secondary | ICD-10-CM | POA: Diagnosis not present

## 2017-11-12 DIAGNOSIS — I152 Hypertension secondary to endocrine disorders: Secondary | ICD-10-CM

## 2017-11-12 MED ORDER — LOSARTAN POTASSIUM 50 MG PO TABS: 50.0000 mg | ORAL_TABLET | Freq: Every day | ORAL | 3 refills | Status: DC

## 2017-11-12 NOTE — Patient Instructions (Signed)
It was very nice to see you today!  Please stop the lisinopril. Please start the losartan.  Come back to see me in a few months for your physical, or sooner as needed.   Take care, Dr Jerline Pain

## 2017-11-12 NOTE — Assessment & Plan Note (Signed)
At goal however will switch from lisinopril 20 mg to losartan 50 mg daily due to side effect.  Continue chlorthalidone 25 mg daily.  Follow-up with me in 3 to 6 months for annual physical.  Check CMET at that time.

## 2017-11-12 NOTE — Assessment & Plan Note (Signed)
A1c 6.9.  Continue metformin 500 mg twice daily.  Discussed lifestyle modifications.  Follow-up in 3 to 6 months.  Recheck A1c at that time.

## 2017-11-12 NOTE — Assessment & Plan Note (Signed)
Testosterone level, hemoglobin/hematocrit, and PSA levels are all stable.  Continue testosterone replacement.

## 2017-11-12 NOTE — Assessment & Plan Note (Signed)
Continue atorvastatin 20 mg daily.  Check lipid panel with next blood draw.

## 2017-11-12 NOTE — Assessment & Plan Note (Signed)
Stable.  Continue allopurinol 100mg daily

## 2017-11-12 NOTE — Progress Notes (Signed)
   Subjective:  Allen Howe is a 57 y.o. male who presents today with a chief complaint of T2DM and to transfer care to this office.   HPI:  T2DM, chronic problem, stable Currently on metformin 500mg  twice daily.  Tolerating well without side effects.  No reported polyuria polydipsia.  HTN, chronic problem, stable Currently on lisinopril 20mg  and chlorthalidone 25mg  daily.  He has developed a cough on lisinopril.  He was switched from losartan lisinopril several months ago and has had persistent dry cough since then.  He is interested in switching.  HLD, chronic problem, stable On Lipitor 10 mg daily.  Tolerating well.  Gout, chronic problem, stable Currently on allopurinol 100 mg daily.  Has not had a gout flare about 15 years.  Also takes colchicine as needed however has not needed it for several years  Erectile dysfunction/hypogonadism On testosterone gel and sildenafil as needed.  Symptoms are well controlled.   ROS: Per HPI  PMH: He reports that he has never smoked. He quit smokeless tobacco use about 12 years ago.  His smokeless tobacco use included chew. He reports that he drinks about 14.0 standard drinks of alcohol per week. He reports that he does not use drugs.  Objective:  Physical Exam: BP 136/84 (BP Location: Left Arm, Patient Position: Sitting, Cuff Size: Large)   Pulse (!) 106   Temp 97.7 F (36.5 C) (Oral)   Ht 5\' 5"  (1.651 m)   Wt 216 lb 9.6 oz (98.2 kg)   SpO2 96%   BMI 36.04 kg/m   Gen: NAD, resting comfortably CV: RRR with no murmurs appreciated Pulm: NWOB, CTAB with no crackles, wheezes, or rhonchi  Assessment/Plan:  Hypogonadism male Testosterone level, hemoglobin/hematocrit, and PSA levels are all stable.  Continue testosterone replacement.  Hypertension associated with diabetes ([name]) At goal however will switch from lisinopril 20 mg to losartan 50 mg daily due to side effect.  Continue chlorthalidone 25 mg daily.  Follow-up with me in 3 to 6  months for annual physical.  Check CMET at that time.  Hyperlipidemia associated with type 2 diabetes mellitus (HCC) Continue atorvastatin 20 mg daily.  Check lipid panel with next blood draw.  Gout Stable.  Continue allopurinol 100 mg daily.  Diabetes mellitus type 2, uncontrolled, without complications (HCC) C5E 6.9.  Continue metformin 500 mg twice daily.  Discussed lifestyle modifications.  Follow-up in 3 to 6 months.  Recheck A1c at that time.  Preventative Healthcare Patient was instructed to return soon for CPE.  Flu shot given today. Health Maintenance Due  Topic Date Due  . HIV Screening  04/17/1975  . FOOT EXAM  12/17/2015  . OPHTHALMOLOGY EXAM  12/21/2015  . INFLUENZA VACCINE  09/18/2017   Allen Howe. Allen Pain, MD 11/12/2017 9:08 AM

## 2018-02-08 IMAGING — US US ABDOMEN LIMITED
1 series · 14 of 25 positions shown · non-contrast
Comparison: No prior.

CLINICAL DATA: Right quadrant pain.

EXAM:
US ABDOMEN LIMITED - RIGHT UPPER QUADRANT

[Series 1: us abdomen limited · 0.24mm/px · 14 of 46 slices shown]
[im 1/46]
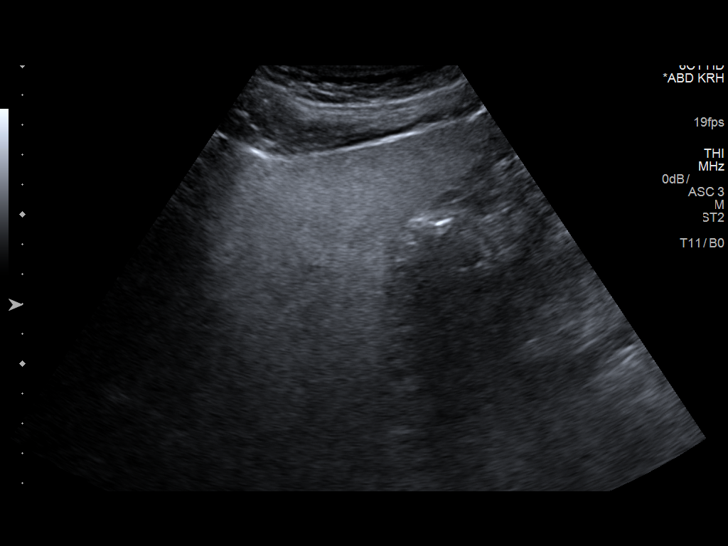
[im 4/46]
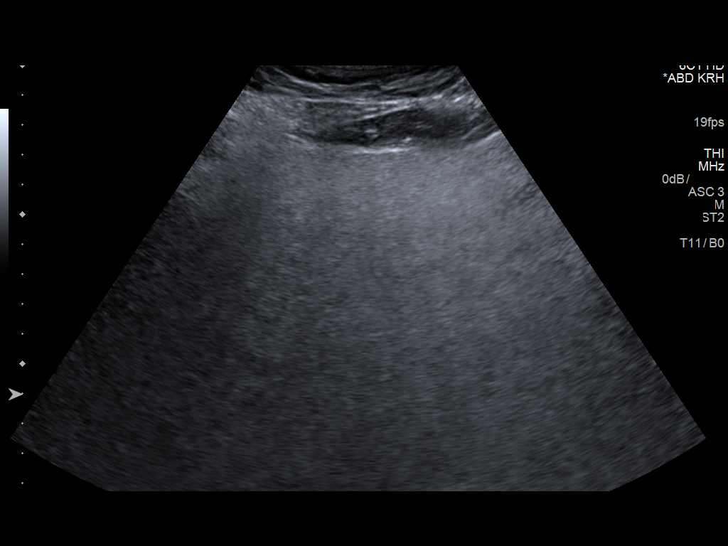
[im 8/46]
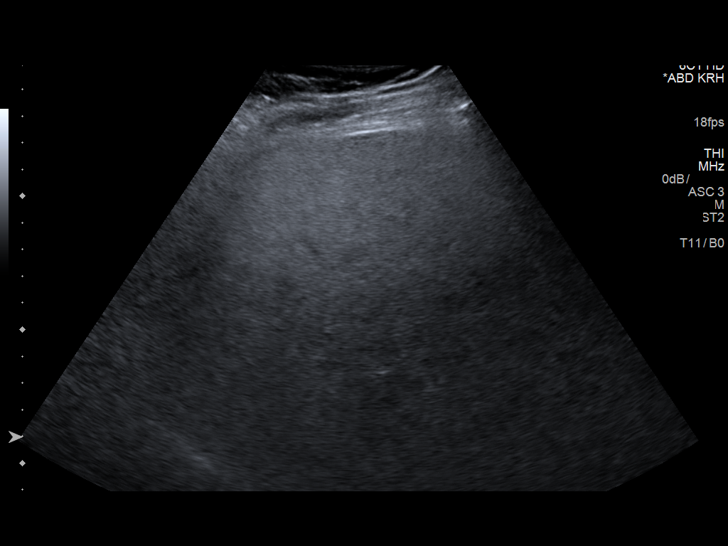
[im 12/46]
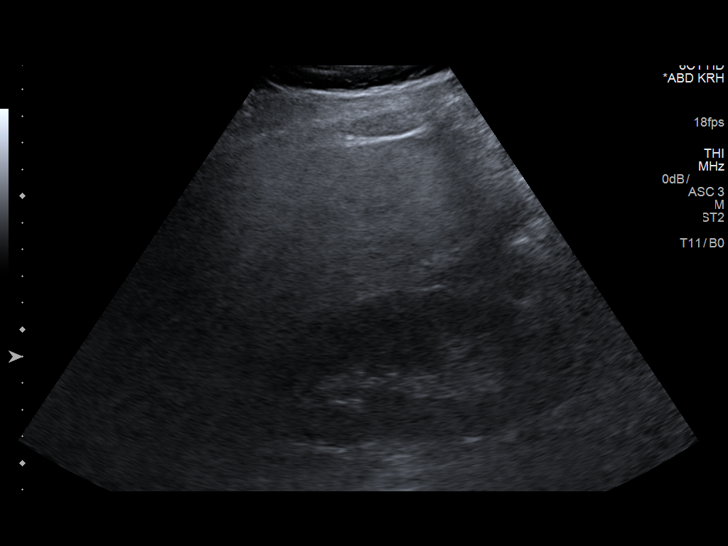
[im 16/46]
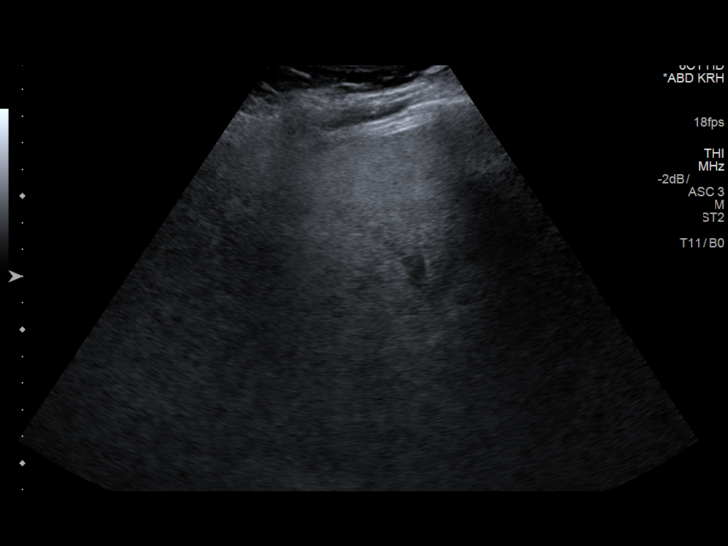
[im 17/46]
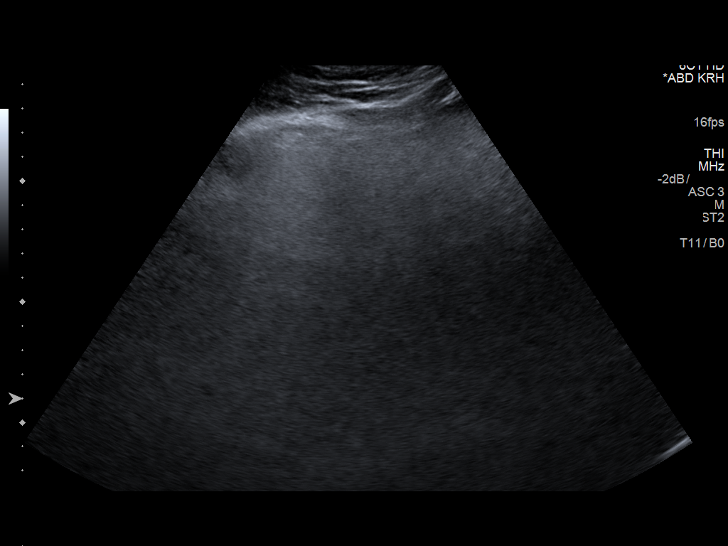
[im 21/46]
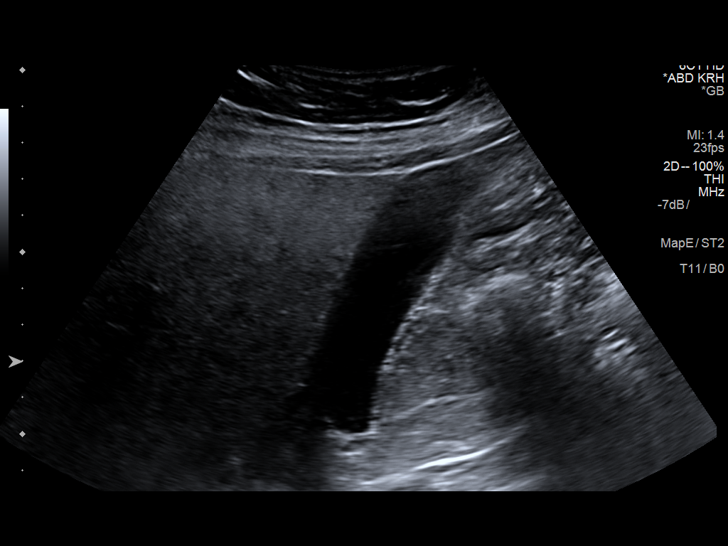
[im 25/46]
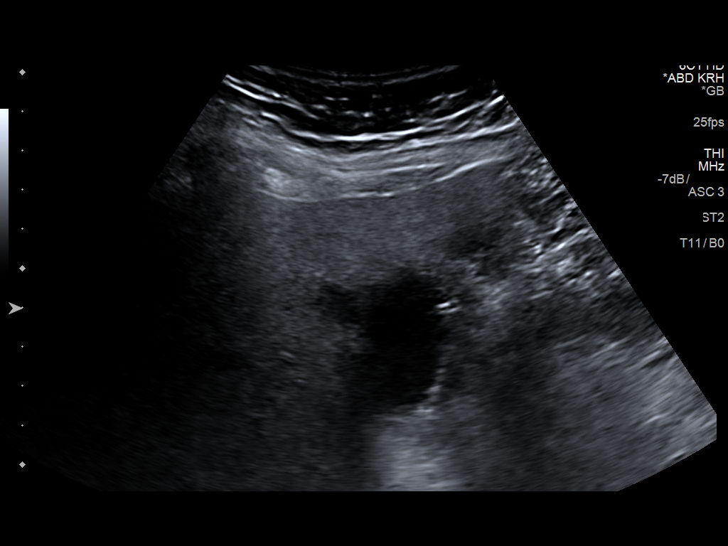
[im 29/46]
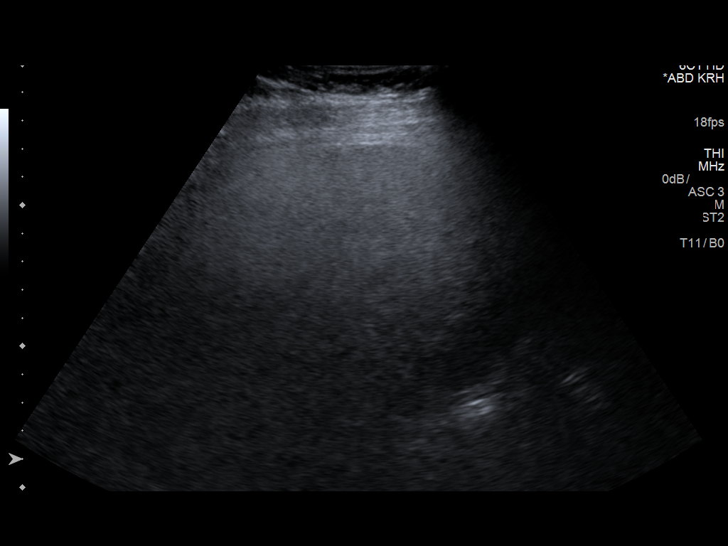
[im 31/46]
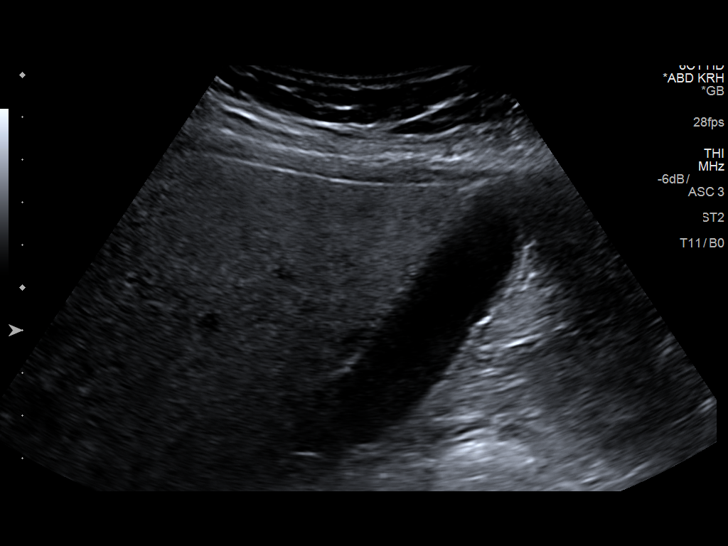
[im 34/46]
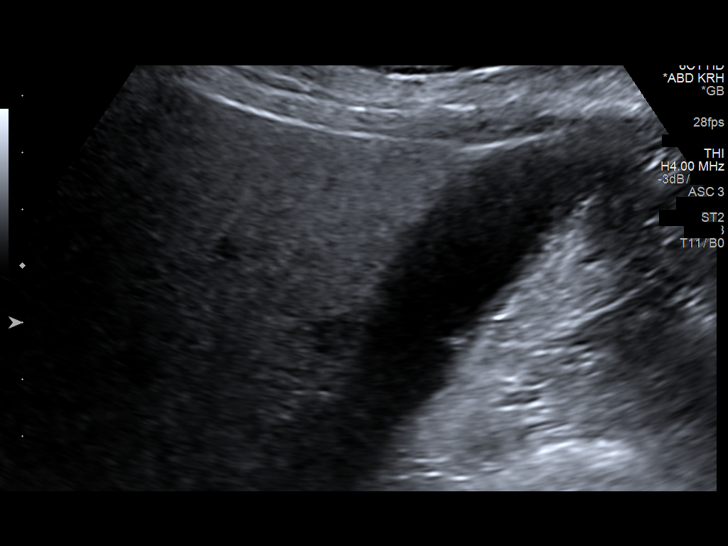
[im 38/46]
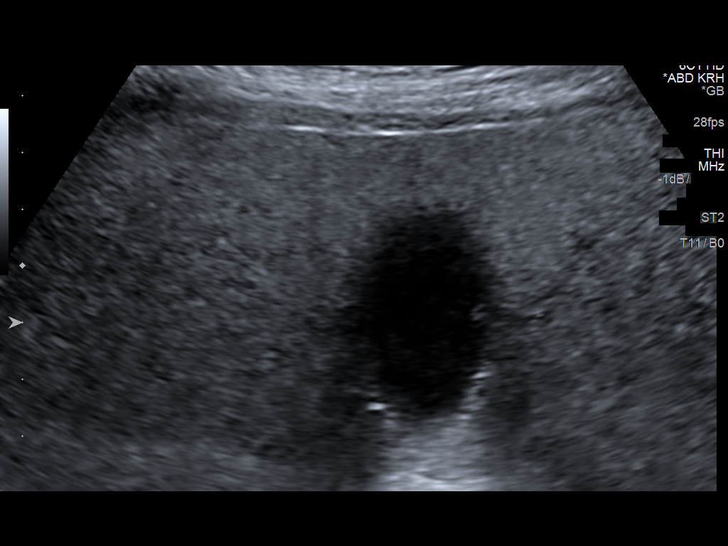
[im 42/46]
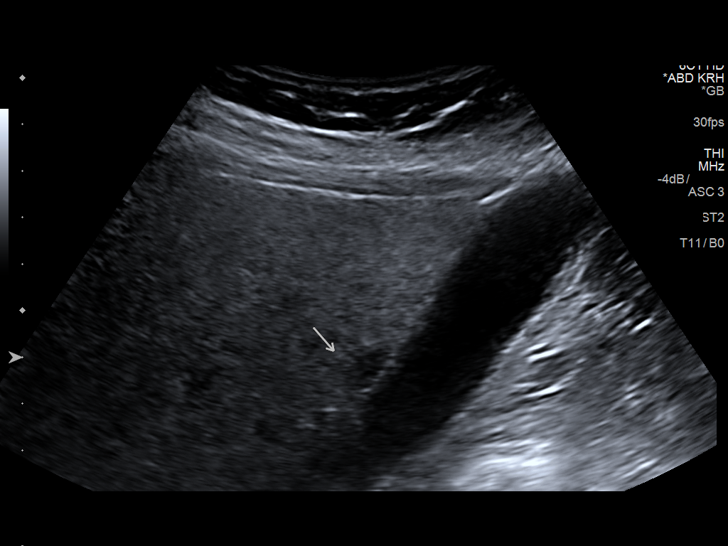
[im 46/46]
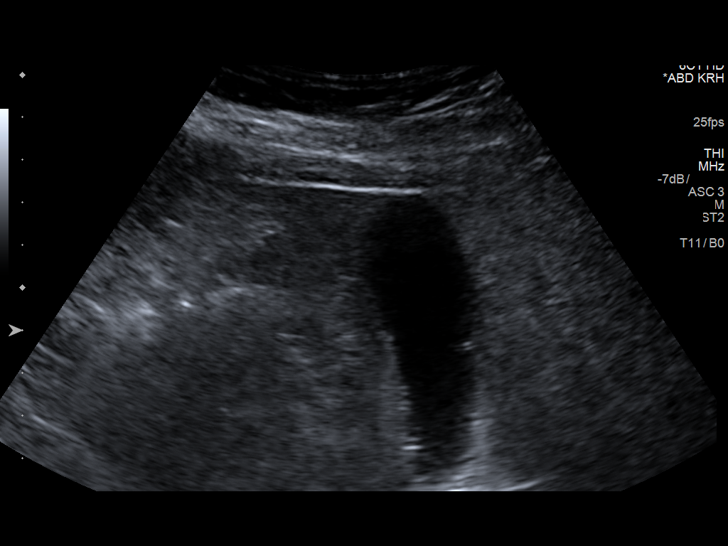

[14 of 25 positions shown; findings below may reference images not displayed]

FINDINGS: Gallbladder:

No gallstones. 4 mm non shadowing non mobile echodensity noted
consistent with polyp. Gallbladder wall thickness normal. Negative
Murphy sign.

Common bile duct:

Diameter: 3 mm

Liver:

Liver is echogenic consistent with fatty infiltration. Area hypo
density noted adjacent to the gallbladder, most consistent with
focal fatty sparing scratched.
IMPRESSION: 1. Echogenic liver consistent with fatty infiltration. Focal fatty
sparing noted adjacent to the gallbladder.

2. 4 mm gall bladder polyp. No gallstones or biliary distention.
Negative Murphy sign.

## 2018-02-23 ENCOUNTER — Other Ambulatory Visit: Payer: Self-pay | Admitting: Family Medicine

## 2018-02-23 DIAGNOSIS — K219 Gastro-esophageal reflux disease without esophagitis: Secondary | ICD-10-CM

## 2018-07-03 ENCOUNTER — Other Ambulatory Visit: Payer: Self-pay | Admitting: Family Medicine

## 2018-07-03 DIAGNOSIS — K219 Gastro-esophageal reflux disease without esophagitis: Secondary | ICD-10-CM

## 2018-07-03 NOTE — Telephone Encounter (Signed)
Please advise 

## 2018-07-03 NOTE — Telephone Encounter (Signed)
Rx for testosterone refilled but he needs OV for DM and HTN follow up. Please aske him to schedule.

## 2018-09-15 LAB — HM DIABETES EYE EXAM

## 2018-09-25 ENCOUNTER — Encounter: Payer: Self-pay | Admitting: Family Medicine

## 2018-10-22 ENCOUNTER — Other Ambulatory Visit: Payer: Self-pay | Admitting: Family Medicine

## 2018-10-22 DIAGNOSIS — K219 Gastro-esophageal reflux disease without esophagitis: Secondary | ICD-10-CM

## 2019-02-04 ENCOUNTER — Other Ambulatory Visit: Payer: Self-pay | Admitting: Family Medicine

## 2019-02-04 DIAGNOSIS — K219 Gastro-esophageal reflux disease without esophagitis: Secondary | ICD-10-CM

## 2019-04-30 ENCOUNTER — Other Ambulatory Visit: Payer: Self-pay | Admitting: Family Medicine

## 2019-04-30 DIAGNOSIS — K219 Gastro-esophageal reflux disease without esophagitis: Secondary | ICD-10-CM

## 2019-07-29 ENCOUNTER — Other Ambulatory Visit: Payer: Self-pay | Admitting: Family Medicine

## 2019-07-29 DIAGNOSIS — K219 Gastro-esophageal reflux disease without esophagitis: Secondary | ICD-10-CM

## 2019-10-27 ENCOUNTER — Other Ambulatory Visit: Payer: Self-pay | Admitting: Family Medicine

## 2020-04-24 ENCOUNTER — Other Ambulatory Visit: Payer: Self-pay | Admitting: Family Medicine

## 2020-07-23 ENCOUNTER — Other Ambulatory Visit: Payer: Self-pay | Admitting: Family Medicine

## 2020-10-21 ENCOUNTER — Other Ambulatory Visit: Payer: Self-pay | Admitting: Family Medicine

## 2021-01-19 ENCOUNTER — Other Ambulatory Visit: Payer: Self-pay | Admitting: Family Medicine

## 2021-04-19 ENCOUNTER — Other Ambulatory Visit: Payer: Self-pay | Admitting: Family Medicine

## 2021-09-12 ENCOUNTER — Other Ambulatory Visit: Payer: Self-pay | Admitting: Family Medicine

## 2021-10-01 ENCOUNTER — Telehealth: Payer: Self-pay | Admitting: Family Medicine

## 2021-10-01 NOTE — Telephone Encounter (Signed)
..   Encourage patient to contact the pharmacy for refills or they can request refills through [place]:  Please schedule appointment if longer than 1 year 11/12/17  NEXT APPOINTMENT DATE: 12/12/21   MEDICATION:losartan (COZAAR) 50 MG tablet  Is the patient out of medication? Yes  PHARMACY: [id number] - [place], [address] Phone:  [phone]  Fax:  [phone]      Let patient know to contact pharmacy at the end of the day to make sure medication is ready.  Please notify patient to allow 48-72 hours to process

## 2021-10-05 NOTE — Telephone Encounter (Signed)
Pt would need an appointment, given she has not been seen in office since 11/12/2017.

## 2021-10-07 ENCOUNTER — Other Ambulatory Visit: Payer: Self-pay | Admitting: Family Medicine

## 2021-11-12 ENCOUNTER — Encounter: Payer: Self-pay | Admitting: *Deleted

## 2021-12-08 ENCOUNTER — Other Ambulatory Visit: Payer: Self-pay | Admitting: Family Medicine

## 2021-12-12 ENCOUNTER — Ambulatory Visit: Payer: BC Managed Care – PPO | Admitting: Family Medicine

## 2021-12-12 ENCOUNTER — Encounter: Payer: Self-pay | Admitting: Family Medicine

## 2021-12-12 VITALS — BP 153/93 | HR 109 | Temp 98.2°F | Ht 65.0 in | Wt 220.6 lb

## 2021-12-12 DIAGNOSIS — I152 Hypertension secondary to endocrine disorders: Secondary | ICD-10-CM | POA: Diagnosis not present

## 2021-12-12 DIAGNOSIS — E1159 Type 2 diabetes mellitus with other circulatory complications: Secondary | ICD-10-CM

## 2021-12-12 DIAGNOSIS — E1165 Type 2 diabetes mellitus with hyperglycemia: Secondary | ICD-10-CM

## 2021-12-12 DIAGNOSIS — R7989 Other specified abnormal findings of blood chemistry: Secondary | ICD-10-CM

## 2021-12-12 DIAGNOSIS — Z23 Encounter for immunization: Secondary | ICD-10-CM

## 2021-12-12 DIAGNOSIS — E785 Hyperlipidemia, unspecified: Secondary | ICD-10-CM

## 2021-12-12 DIAGNOSIS — Z125 Encounter for screening for malignant neoplasm of prostate: Secondary | ICD-10-CM

## 2021-12-12 DIAGNOSIS — G4733 Obstructive sleep apnea (adult) (pediatric): Secondary | ICD-10-CM | POA: Diagnosis not present

## 2021-12-12 DIAGNOSIS — M109 Gout, unspecified: Secondary | ICD-10-CM | POA: Diagnosis not present

## 2021-12-12 DIAGNOSIS — F419 Anxiety disorder, unspecified: Secondary | ICD-10-CM

## 2021-12-12 DIAGNOSIS — J301 Allergic rhinitis due to pollen: Secondary | ICD-10-CM

## 2021-12-12 DIAGNOSIS — E1169 Type 2 diabetes mellitus with other specified complication: Secondary | ICD-10-CM | POA: Diagnosis not present

## 2021-12-12 DIAGNOSIS — K219 Gastro-esophageal reflux disease without esophagitis: Secondary | ICD-10-CM

## 2021-12-12 LAB — COMPREHENSIVE METABOLIC PANEL
ALT: 68 U/L — ABNORMAL HIGH (ref 0–53)
AST: 55 U/L — ABNORMAL HIGH (ref 0–37)
Albumin: 4.4 g/dL (ref 3.5–5.2)
Alkaline Phosphatase: 63 U/L (ref 39–117)
BUN: 16 mg/dL (ref 6–23)
CO2: 30 mEq/L (ref 19–32)
Calcium: 10.4 mg/dL (ref 8.4–10.5)
Chloride: 89 mEq/L — ABNORMAL LOW (ref 96–112)
Creatinine, Ser: 0.83 mg/dL (ref 0.40–1.50)
GFR: 93.71 mL/min (ref >60.00)
Glucose, Bld: 226 mg/dL — ABNORMAL HIGH (ref 70–99)
Potassium: 3.6 mEq/L (ref 3.5–5.1)
Sodium: 131 mEq/L — ABNORMAL LOW (ref 135–145)
Total Bilirubin: 0.7 mg/dL (ref 0.2–1.2)
Total Protein: 7.2 g/dL (ref 6.0–8.3)

## 2021-12-12 LAB — CBC
HCT: 39.1 % (ref 39.0–52.0)
Hemoglobin: 13.4 g/dL (ref 13.0–17.0)
MCHC: 34.2 g/dL (ref 30.0–36.0)
MCV: 96.1 fl (ref 78.0–100.0)
Platelets: 213 10*3/uL (ref 150.0–400.0)
RBC: 4.07 Mil/uL — ABNORMAL LOW (ref 4.22–5.81)
RDW: 12.9 % (ref 11.5–15.5)
WBC: 7 10*3/uL (ref 4.0–10.5)

## 2021-12-12 LAB — LDL CHOLESTEROL, DIRECT: Direct LDL: 80 mg/dL

## 2021-12-12 LAB — URIC ACID: Uric Acid, Serum: 5.5 mg/dL (ref 4.0–7.8)

## 2021-12-12 LAB — PSA: PSA: 1.09 ng/mL (ref 0.10–4.00)

## 2021-12-12 LAB — HEMOGLOBIN A1C: Hgb A1c MFr Bld: 9.1 % — ABNORMAL HIGH (ref 4.6–6.5)

## 2021-12-12 LAB — LIPID PANEL
Cholesterol: 167 mg/dL (ref 0–200)
HDL: 62.5 mg/dL (ref >39.00)
NonHDL: 104.25
Total CHOL/HDL Ratio: 3
Triglycerides: 303 mg/dL — ABNORMAL HIGH (ref 0.0–149.0)
VLDL: 60.6 mg/dL — ABNORMAL HIGH (ref 0.0–40.0)

## 2021-12-12 LAB — TSH: TSH: 1.83 u[IU]/mL (ref 0.35–5.50)

## 2021-12-12 MED ORDER — LOSARTAN POTASSIUM 100 MG PO TABS: 100.0000 mg | ORAL_TABLET | Freq: Every day | ORAL | 3 refills | Status: DC

## 2021-12-12 MED ORDER — CITALOPRAM HYDROBROMIDE 20 MG PO TABS: 20.0000 mg | ORAL_TABLET | Freq: Every day | ORAL | 3 refills | Status: DC

## 2021-12-12 NOTE — Assessment & Plan Note (Signed)
Stable on Prilosec 40 mg daily. 

## 2021-12-12 NOTE — Assessment & Plan Note (Signed)
On Lipitor 20 mg daily.  Check lipids. 

## 2021-12-12 NOTE — Assessment & Plan Note (Signed)
Continue Rhinocort and Allegra as needed.

## 2021-12-12 NOTE — Assessment & Plan Note (Signed)
No recent flares.  He has been working on reducing purine intake.  We will check uric acid level today.  He is on allopurinol 100 mg daily.

## 2021-12-12 NOTE — Assessment & Plan Note (Signed)
Symptoms are not controlled.  No SI or HI.  We discussed treatment options with patient today.  He is not interested in therapy at this point.  Believe he would do well with an SSRI.  We will start Celexa 20 mg daily.  He will follow-up with me in a couple of weeks and we can adjust the dose as needed.  We discussed potential side effects.

## 2021-12-12 NOTE — Progress Notes (Signed)
Allen Howe is a 61 y.o. male who presents today for an office visit.  He was last seen here over 3 years ago.   Assessment/Plan:  Chronic Problems Addressed Today: Anxiety Symptoms are not controlled.  No SI or HI.  We discussed treatment options with patient today.  He is not interested in therapy at this point.  Believe he would do well with an SSRI.  We will start Celexa 20 mg daily.  He will follow-up with me in a couple of weeks and we can adjust the dose as needed.  We discussed potential side effects.  Hyperlipidemia associated with type 2 diabetes mellitus (HCC) On Lipitor 20 mg daily.  Check lipids.  Allergic rhinitis due to pollen Continue Rhinocort and Allegra as needed.  Gout No recent flares.  He has been working on reducing purine intake.  We will check uric acid level today.  He is on allopurinol 100 mg daily.  T2DM (type 2 diabetes mellitus) (HCC) Check A1c.  He has been doing a great job with diet and exercise.  He is on metformin 500 mg daily.  Hypertension associated with diabetes ([place]) Blood pressures not controlled today.  He is seeking treatment for his OSA and will be starting CPAP soon which should help.  We will also be treating his stress and anxiety levels as above.  We will increase his losartan to 100 mg daily.  Continue chlorthalidone 25 mg daily.  Check labs today.  He will follow-up in a few weeks and we can adjust the medications as needed.  OSA (obstructive sleep apnea) We will be starting CPAP soon.  Gastroesophageal reflux disease without esophagitis Stable on Prilosec 40 mg daily.  Flu shot given today.    Subjective:  HPI:  See A/p for status of chronic conditions.    He was last seen here over 3 years ago.  He has been working hard on diet and exercise.  He has been compliant with his medications.  Does note that his blood pressures have been increasing the last several months and was found to be in the 170s to 180s at the  periodontist. He attributes this mostly to anxiety.  He has been compliant with his medications.  He has home blood pressure insert typically in the 140s to 150s.  He has been under a lot of stress and anxiety last years.  He feels like he stresses more than he should.  His has a lot of worry.  Sometimes wakes up at night worrying about things.  Also noticed more irritability at work.  ROS: Per HPI, otherwise a complete review of systems was negative.   PMH:  The following were reviewed and entered/updated in epic: Past Medical History:  Diagnosis Date   Alcohol abuse 04/23/2017   Allergic rhinitis due to pollen    Basal cell carcinoma    Basal Cell on neck   Diabetes type 2, controlled ([place])    Diverticulosis    Fatty liver    Gout    Hyperlipidemia LDL goal <70 01/06/2014   Hypertension    Hypogonadism male    Internal hemorrhoids    Kidney stones    Patient Active Problem List   Diagnosis Date Noted   OSA (obstructive sleep apnea) 12/12/2021   Anxiety 12/12/2021   Alcohol abuse 04/23/2017   Gastroesophageal reflux disease without esophagitis 05/23/2015   Hyperlipidemia associated with type 2 diabetes mellitus ([place]) 01/06/2014   Hypertension associated with diabetes ([place])    Kidney  stones    T2DM (type 2 diabetes mellitus) ([employer])    Basal cell carcinoma    Gout    Allergic rhinitis due to pollen    Hypogonadism male    Past Surgical History:  Procedure Laterality Date   VASECTOMY      Family History  Problem Relation Age of Onset   Hypertension Mother    Gallbladder disease Mother    Heart disease Father    Hypertension Father    Diabetes Father    Skin cancer Father    Colon cancer Neg Hx     Medications- reviewed and updated Current Outpatient Medications  Medication Sig Dispense Refill   allopurinol (ZYLOPRIM) 100 MG tablet TAKE ONE TABLET BY MOUTH DAILY 90 tablet 1   atorvastatin (LIPITOR) 20 MG tablet TAKE ONE TABLET BY MOUTH DAILY 90 tablet 1    chlorthalidone (HYGROTON) 25 MG tablet TAKE ONE TABLET BY MOUTH DAILY 90 tablet 1   Cholecalciferol (VITAMIN D3) 2000 units TABS Take 1 tablet by mouth daily. Reported on 07/05/2015     citalopram (CELEXA) 20 MG tablet Take 1 tablet (20 mg total) by mouth daily. 30 tablet 3   colchicine 0.6 MG tablet Take 1 tablet (0.6 mg total) by mouth daily. Take two tablets at onset on symptoms for gout 30 tablet 11   fexofenadine (ALLEGRA) 180 MG tablet Take 180 mg by mouth daily.       metFORMIN (GLUCOPHAGE) 500 MG tablet TAKE ONE TABLET BY MOUTH TWICE A DAY 180 tablet 1   omeprazole (PRILOSEC) 40 MG capsule TAKE ONE CAPSULE BY MOUTH DAILY 90 capsule 0   RHINOCORT AQUA 32 MCG/ACT nasal spray PLACE 2 SPRAYS INTO BOTH NOSTRILS DAILY 8.43 mL 2   Testosterone 20.25 MG/ACT (1.62%) GEL APPLY 2 PUMPS TO CLEAN SHOULDERS EVERY MORNING 75 g 0   losartan (COZAAR) 100 MG tablet Take 1 tablet (100 mg total) by mouth daily. 90 tablet 3   No current facility-administered medications for this visit.    Allergies-reviewed and updated No Known Allergies  Social History   Socioeconomic History   Marital status: Divorced    Spouse name: Not on file   Number of children: 2   Years of education: Not on file   Highest education level: Not on file  Occupational History   Occupation: accountant  Tobacco Use   Smoking status: Never   Smokeless tobacco: Former    Types: Chew    Quit date: 10/14/2005  Substance and Sexual Activity   Alcohol use: Yes    Alcohol/week: 14.0 standard drinks of alcohol    Types: 14 Standard drinks or equivalent per week    Comment: occ   Drug use: No   Sexual activity: Not Currently  Other Topics Concern   Not on file  Social History Narrative   Divorced   Dual custody of children   Social Determinants of Health   Financial Resource Strain: Not on file  Food Insecurity: Not on file  Transportation Needs: Not on file  Physical Activity: Not on file  Stress: Not on file  Social  Connections: Not on file          Objective:  Physical Exam: BP (!) 174/100   Pulse (!) 109   Temp 98.2 F (36.8 C) (Temporal)   Ht '5\' 5"'$  (1.651 m)   Wt 220 lb 9.6 oz (100.1 kg)   SpO2 97%   BMI 36.71 kg/m   Gen: No acute distress, resting comfortably CV:  Regular rate and rhythm with no murmurs appreciated Pulm: Normal work of breathing, clear to auscultation bilaterally with no crackles, wheezes, or rhonchi Neuro: Grossly normal, moves all extremities Psych: Normal affect and thought content      Josian Lanese M. Jerline Pain, MD 12/12/2021 9:51 AM

## 2021-12-12 NOTE — Patient Instructions (Signed)
It was very nice to see you today!  We will start Celexa 20 mg daily.  We will increase your losartan 100 mg daily.  See me message in a few weeks to let me know how the Celexa is working and how your blood pressures are looking.  We will check blood work today and get your flu shot.  We will need to see you back in 3 to 6 months depending on results of your blood work.  Take care, Dr Jerline Pain  PLEASE NOTE:  If you had any lab tests please let us know if you have not heard back within a few days. You may see your results on mychart before we have a chance to review them but we will give you a call once they are reviewed by [place]. If we ordered any referrals today, please let us know if you have not heard from their office within the next week.   Please try these tips to maintain a healthy lifestyle:  Eat at least 3 REAL meals and 1-2 snacks per day.  Aim for no more than 5 hours between eating.  If you eat breakfast, please do so within one hour of getting up.   Each meal should contain half fruits/vegetables, one quarter protein, and one quarter carbs (no bigger than a computer mouse)  Cut down on sweet beverages. This includes juice, soda, and sweet tea.   Drink at least 1 glass of water with each meal and aim for at least 8 glasses per day  Exercise at least 150 minutes every week.    Preventive Care 57-60 Years Old, Male Preventive care refers to lifestyle choices and visits with your health care provider that can promote health and wellness. Preventive care visits are also called wellness exams. What can I expect for my preventive care visit? Counseling During your preventive care visit, your health care provider may ask about your: Medical history, including: Past medical problems. Family medical history. Current health, including: Emotional well-being. Home life and relationship well-being. Sexual activity. Lifestyle, including: Alcohol, nicotine or tobacco, and drug  use. Access to firearms. Diet, exercise, and sleep habits. Safety issues such as seatbelt and bike helmet use. Sunscreen use. Work and work Statistician. Physical exam Your health care provider will check your: Height and weight. These may be used to calculate your BMI (body mass index). BMI is a measurement that tells if you are at a healthy weight. Waist circumference. This measures the distance around your waistline. This measurement also tells if you are at a healthy weight and may help predict your risk of certain diseases, such as type 2 diabetes and high blood pressure. Heart rate and blood pressure. Body temperature. Skin for abnormal spots. What immunizations do I need?  Vaccines are usually given at various ages, according to a schedule. Your health care provider will recommend vaccines for you based on your age, medical history, and lifestyle or other factors, such as travel or where you work. What tests do I need? Screening Your health care provider may recommend screening tests for certain conditions. This may include: Lipid and cholesterol levels. Diabetes screening. This is done by checking your blood sugar (glucose) after you have not eaten for a while (fasting). Hepatitis B test. Hepatitis C test. HIV (human immunodeficiency virus) test. STI (sexually transmitted infection) testing, if you are at risk. Lung cancer screening. Prostate cancer screening. Colorectal cancer screening. Talk with your health care provider about your test results, treatment options, and if  necessary, the need for more tests. Follow these instructions at home: Eating and drinking  Eat a diet that includes fresh fruits and vegetables, whole grains, lean protein, and low-fat dairy products. Take vitamin and mineral supplements as recommended by your health care provider. Do not drink alcohol if your health care provider tells you not to drink. If you drink alcohol: Limit how much you have to  0-2 drinks a day. Know how much alcohol is in your drink. In the [place] one drink equals one 12 oz bottle of beer (355 mL), one 5 oz glass of wine (148 mL), or one 1 oz glass of hard liquor (44 mL). Lifestyle Brush your teeth every morning and night with fluoride toothpaste. Floss one time each day. Exercise for at least 30 minutes 5 or more days each week. Do not use any products that contain nicotine or tobacco. These products include cigarettes, chewing tobacco, and vaping devices, such as e-cigarettes. If you need help quitting, ask your health care provider. Do not use drugs. If you are sexually active, practice safe sex. Use a condom or other form of protection to prevent STIs. Take aspirin only as told by your health care provider. Make sure that you understand how much to take and what form to take. Work with your health care provider to find out whether it is safe and beneficial for you to take aspirin daily. Find healthy ways to manage stress, such as: Meditation, yoga, or listening to music. Journaling. Talking to a trusted person. Spending time with friends and family. Minimize exposure to UV radiation to reduce your risk of skin cancer. Safety Always wear your seat belt while driving or riding in a vehicle. Do not drive: If you have been drinking alcohol. Do not ride with someone who has been drinking. When you are tired or distracted. While texting. If you have been using any mind-altering substances or drugs. Wear a helmet and other protective equipment during sports activities. If you have firearms in your house, make sure you follow all gun safety procedures. What's next? Go to your health care provider once a year for an annual wellness visit. Ask your health care provider how often you should have your eyes and teeth checked. Stay up to date on all vaccines. This information is not intended to replace advice given to you by your health care provider. Make sure you  discuss any questions you have with your health care provider. Document Revised: 08/02/2020 Document Reviewed: 08/02/2020 Elsevier Patient Education  [place].

## 2021-12-12 NOTE — Assessment & Plan Note (Signed)
Check A1c.  He has been doing a great job with diet and exercise.  He is on metformin 500 mg daily.

## 2021-12-12 NOTE — Assessment & Plan Note (Signed)
We will be starting CPAP soon.

## 2021-12-12 NOTE — Assessment & Plan Note (Signed)
Blood pressures not controlled today.  He is seeking treatment for his OSA and will be starting CPAP soon which should help.  We will also be treating his stress and anxiety levels as above.  We will increase his losartan to 100 mg daily.  Continue chlorthalidone 25 mg daily.  Check labs today.  He will follow-up in a few weeks and we can adjust the medications as needed.

## 2021-12-17 NOTE — Progress Notes (Signed)
Please inform patient of the following:  A1c is elevated.  Recommend we increase his metformin to 1000 mg daily.  Please send a new prescription.  Would like for him to come back in 4 months for an office visit to recheck A1c.  Liver numbers are also elevated.  I would like for him to come back in 1 to 2 weeks to recheck.  Please place future order for c-Met.  Can recheck everything else in a year.

## 2021-12-18 MED ORDER — METFORMIN HCL 1000 MG PO TABS: 1000.0000 mg | ORAL_TABLET | Freq: Every day | ORAL | 0 refills | Status: DC

## 2021-12-18 NOTE — Addendum Note (Signed)
Addended by: Bing Neighbors on: 12/18/2021 02:28 PM   Modules accepted: Orders

## 2021-12-18 NOTE — Addendum Note (Signed)
Addended by: Bing Neighbors on: 12/18/2021 02:33 PM   Modules accepted: Orders

## 2021-12-26 ENCOUNTER — Other Ambulatory Visit: Payer: BC Managed Care – PPO

## 2022-02-15 ENCOUNTER — Encounter: Payer: Self-pay | Admitting: Family Medicine

## 2022-02-19 NOTE — Telephone Encounter (Signed)
Please send in rx for '1000mg'$  twice daily. He can double up his current rx.   [name]. Jerline Pain, MD 02/19/2022 9:39 PM

## 2022-02-20 ENCOUNTER — Other Ambulatory Visit: Payer: Self-pay | Admitting: *Deleted

## 2022-02-20 MED ORDER — METFORMIN HCL 1000 MG PO TABS: 1000.0000 mg | ORAL_TABLET | Freq: Two times a day (BID) | ORAL | 1 refills | Status: DC

## 2022-02-20 NOTE — Telephone Encounter (Signed)
Spoke with patient, advise to take Metformin '1000mg'$  BID as prescribed  Patient verbalized understanding  New prescription send to pharmacy

## 2022-04-24 ENCOUNTER — Ambulatory Visit: Payer: BC Managed Care – PPO | Admitting: Family Medicine

## 2022-05-23 ENCOUNTER — Ambulatory Visit: Payer: BC Managed Care – PPO | Admitting: Family Medicine

## 2022-06-12 ENCOUNTER — Ambulatory Visit: Payer: BC Managed Care – PPO | Admitting: Family Medicine

## 2022-06-26 ENCOUNTER — Other Ambulatory Visit: Payer: Self-pay | Admitting: Family Medicine

## 2022-09-19 ENCOUNTER — Other Ambulatory Visit: Payer: Self-pay | Admitting: Family Medicine

## 2022-12-21 ENCOUNTER — Other Ambulatory Visit: Payer: Self-pay | Admitting: Family Medicine

## 2022-12-23 ENCOUNTER — Other Ambulatory Visit: Payer: Self-pay | Admitting: Family Medicine

## 2023-01-20 ENCOUNTER — Other Ambulatory Visit: Payer: Self-pay | Admitting: Family Medicine

## 2023-02-18 ENCOUNTER — Other Ambulatory Visit: Payer: Self-pay | Admitting: Family Medicine

## 2023-03-03 ENCOUNTER — Other Ambulatory Visit (HOSPITAL_BASED_OUTPATIENT_CLINIC_OR_DEPARTMENT_OTHER): Payer: Self-pay

## 2023-03-03 ENCOUNTER — Ambulatory Visit: Payer: Commercial Managed Care - HMO | Admitting: Family Medicine

## 2023-03-03 ENCOUNTER — Encounter: Payer: Self-pay | Admitting: Family Medicine

## 2023-03-03 VITALS — BP 150/98 | HR 122 | Temp 99.4°F | Ht 65.0 in | Wt 213.0 lb

## 2023-03-03 DIAGNOSIS — E1159 Type 2 diabetes mellitus with other circulatory complications: Secondary | ICD-10-CM

## 2023-03-03 DIAGNOSIS — M109 Gout, unspecified: Secondary | ICD-10-CM | POA: Diagnosis not present

## 2023-03-03 DIAGNOSIS — E1165 Type 2 diabetes mellitus with hyperglycemia: Secondary | ICD-10-CM

## 2023-03-03 DIAGNOSIS — I152 Hypertension secondary to endocrine disorders: Secondary | ICD-10-CM | POA: Diagnosis not present

## 2023-03-03 DIAGNOSIS — E785 Hyperlipidemia, unspecified: Secondary | ICD-10-CM

## 2023-03-03 DIAGNOSIS — Z7984 Long term (current) use of oral hypoglycemic drugs: Secondary | ICD-10-CM

## 2023-03-03 DIAGNOSIS — J301 Allergic rhinitis due to pollen: Secondary | ICD-10-CM

## 2023-03-03 DIAGNOSIS — F419 Anxiety disorder, unspecified: Secondary | ICD-10-CM

## 2023-03-03 DIAGNOSIS — E1169 Type 2 diabetes mellitus with other specified complication: Secondary | ICD-10-CM

## 2023-03-03 DIAGNOSIS — Z125 Encounter for screening for malignant neoplasm of prostate: Secondary | ICD-10-CM

## 2023-03-03 DIAGNOSIS — G4733 Obstructive sleep apnea (adult) (pediatric): Secondary | ICD-10-CM

## 2023-03-03 DIAGNOSIS — Z0001 Encounter for general adult medical examination with abnormal findings: Secondary | ICD-10-CM

## 2023-03-03 DIAGNOSIS — Z23 Encounter for immunization: Secondary | ICD-10-CM

## 2023-03-03 LAB — COMPREHENSIVE METABOLIC PANEL
ALT: 34 U/L (ref 0–53)
AST: 27 U/L (ref 0–37)
Albumin: 4.5 g/dL (ref 3.5–5.2)
Alkaline Phosphatase: 53 U/L (ref 39–117)
BUN: 12 mg/dL (ref 6–23)
CO2: 29 meq/L (ref 19–32)
Calcium: 10.1 mg/dL (ref 8.4–10.5)
Chloride: 89 meq/L — ABNORMAL LOW (ref 96–112)
Creatinine, Ser: 0.85 mg/dL (ref 0.40–1.50)
GFR: 92.24 mL/min (ref >60.00)
Glucose, Bld: 200 mg/dL — ABNORMAL HIGH (ref 70–99)
Potassium: 3.5 meq/L (ref 3.5–5.1)
Sodium: 131 meq/L — ABNORMAL LOW (ref 135–145)
Total Bilirubin: 0.5 mg/dL (ref 0.2–1.2)
Total Protein: 7.3 g/dL (ref 6.0–8.3)

## 2023-03-03 LAB — CBC
HCT: 36.8 % — ABNORMAL LOW (ref 39.0–52.0)
Hemoglobin: 12.7 g/dL — ABNORMAL LOW (ref 13.0–17.0)
MCHC: 34.4 g/dL (ref 30.0–36.0)
MCV: 98.1 fL (ref 78.0–100.0)
Platelets: 228 10*3/uL (ref 150.0–400.0)
RBC: 3.75 Mil/uL — ABNORMAL LOW (ref 4.22–5.81)
RDW: 12.8 % (ref 11.5–15.5)
WBC: 8.1 10*3/uL (ref 4.0–10.5)

## 2023-03-03 LAB — HEMOGLOBIN A1C: Hgb A1c MFr Bld: 6.8 % — ABNORMAL HIGH (ref 4.6–6.5)

## 2023-03-03 LAB — LIPID PANEL
Cholesterol: 169 mg/dL (ref 0–200)
HDL: 82.1 mg/dL (ref >39.00)
LDL Cholesterol: 56 mg/dL (ref 0–99)
NonHDL: 87.39
Total CHOL/HDL Ratio: 2
Triglycerides: 156 mg/dL — ABNORMAL HIGH (ref 0.0–149.0)
VLDL: 31.2 mg/dL (ref 0.0–40.0)

## 2023-03-03 LAB — MICROALBUMIN / CREATININE URINE RATIO
Creatinine,U: 104.8 mg/dL
Microalb Creat Ratio: 3 mg/g (ref 0.0–30.0)
Microalb, Ur: 3.2 mg/dL — ABNORMAL HIGH (ref 0.0–1.9)

## 2023-03-03 LAB — URIC ACID: Uric Acid, Serum: 5 mg/dL (ref 4.0–7.8)

## 2023-03-03 LAB — TSH: TSH: 2.35 u[IU]/mL (ref 0.35–5.50)

## 2023-03-03 LAB — PSA: PSA: 2.08 ng/mL (ref 0.10–4.00)

## 2023-03-03 MED ORDER — METFORMIN HCL 1000 MG PO TABS: 1000.0000 mg | ORAL_TABLET | Freq: Every day | ORAL | 0 refills | Status: DC

## 2023-03-03 MED ORDER — CITALOPRAM HYDROBROMIDE 20 MG PO TABS: 20.0000 mg | ORAL_TABLET | Freq: Every day | ORAL | 3 refills | Status: DC

## 2023-03-03 MED ORDER — LOSARTAN POTASSIUM 50 MG PO TABS: 50.0000 mg | ORAL_TABLET | Freq: Every day | ORAL | 1 refills | Status: DC

## 2023-03-03 MED ORDER — METFORMIN HCL 500 MG PO TABS: 500.0000 mg | ORAL_TABLET | Freq: Two times a day (BID) | ORAL | 1 refills | Status: DC

## 2023-03-03 MED ORDER — AZELASTINE HCL 0.1 % NA SOLN
2.0000 | Freq: Two times a day (BID) | NASAL | 12 refills | Status: DC
  Filled 2023-03-03: qty 30, 50d supply, fill #0
  Filled 2023-04-03 – 2023-04-09 (×2): qty 30, 50d supply, fill #1
  Filled 2023-06-11: qty 30, 50d supply, fill #2
  Filled 2023-09-01: qty 30, 50d supply, fill #3
  Filled 2023-10-16: qty 30, 50d supply, fill #4
  Filled 2023-12-05: qty 30, 50d supply, fill #5
  Filled 2023-12-25 – 2024-01-25 (×2): qty 30, 50d supply, fill #6

## 2023-03-03 NOTE — Assessment & Plan Note (Signed)
 Elevated today.  His home readings have been at goal per patient.  He is on losartan  100 mg daily.  He is not taking chlorthalidone .  We did discussed making medication adjustments today however he would like to hold off on this for now.  He will continue to work on diet and exercise.  Per patient he does have some element of whitecoat hypertension as well.  Will check labs today.  He will continue to monitor at home.  If still elevated at home would consider addition of chlorthalidone  or amlodipine .

## 2023-03-03 NOTE — Assessment & Plan Note (Signed)
 Not fully controlled on Rhinocort and Allegra.  Will add on Astelin.  Will refer to ENT if this continues to be an issue.

## 2023-03-03 NOTE — Assessment & Plan Note (Signed)
 Stable.  No recent flares.  On allopurinol 100 mg daily.  Check uric acid level today.

## 2023-03-03 NOTE — Patient Instructions (Addendum)
 It was very nice to see you today!  We will check blood work today.   We will give you your shingles vaccine today.  Please continue to work on diet and exercise.  Please keep 90 blood pressure and let us  know if persistently elevated.    Return in about 1 year (around 03/02/2024) for Annual Physical.   Take care, Dr Kennyth  PLEASE NOTE:  If you had any lab tests, please let us  know if you have not heard back within a few days. You may see your results on mychart before we have a chance to review them but we will give you a call once they are reviewed by us .   If we ordered any referrals today, please let us  know if you have not heard from their office within the next week.   If you had any urgent prescriptions sent in today, please check with the pharmacy within an hour of our visit to make sure the prescription was transmitted appropriately.   Please try these tips to maintain a healthy lifestyle:  Eat at least 3 REAL meals and 1-2 snacks per day.  Aim for no more than 5 hours between eating.  If you eat breakfast, please do so within one hour of getting up.   Each meal should contain half fruits/vegetables, one quarter protein, and one quarter carbs (no bigger than a computer mouse)  Cut down on sweet beverages. This includes juice, soda, and sweet tea.   Drink at least 1 glass of water with each meal and aim for at least 8 glasses per day  Exercise at least 150 minutes every week.    Preventive Care 55-83 Years Old, Male Preventive care refers to lifestyle choices and visits with your health care provider that can promote health and wellness. Preventive care visits are also called wellness exams. What can I expect for my preventive care visit? Counseling During your preventive care visit, your health care provider may ask about your: Medical history, including: Past medical problems. Family medical history. Current health, including: Emotional well-being. Home life and  relationship well-being. Sexual activity. Lifestyle, including: Alcohol, nicotine or tobacco, and drug use. Access to firearms. Diet, exercise, and sleep habits. Safety issues such as seatbelt and bike helmet use. Sunscreen use. Work and work astronomer. Physical exam Your health care provider will check your: Height and weight. These may be used to calculate your BMI (body mass index). BMI is a measurement that tells if you are at a healthy weight. Waist circumference. This measures the distance around your waistline. This measurement also tells if you are at a healthy weight and may help predict your risk of certain diseases, such as type 2 diabetes and high blood pressure. Heart rate and blood pressure. Body temperature. Skin for abnormal spots. What immunizations do I need?  Vaccines are usually given at various ages, according to a schedule. Your health care provider will recommend vaccines for you based on your age, medical history, and lifestyle or other factors, such as travel or where you work. What tests do I need? Screening Your health care provider may recommend screening tests for certain conditions. This may include: Lipid and cholesterol levels. Diabetes screening. This is done by checking your blood sugar (glucose) after you have not eaten for a while (fasting). Hepatitis B test. Hepatitis C test. HIV (human immunodeficiency virus) test. STI (sexually transmitted infection) testing, if you are at risk. Lung cancer screening. Prostate cancer screening. Colorectal cancer screening. Talk with  your health care provider about your test results, treatment options, and if necessary, the need for more tests. Follow these instructions at home: Eating and drinking  Eat a diet that includes fresh fruits and vegetables, whole grains, lean protein, and low-fat dairy products. Take vitamin and mineral supplements as recommended by your health care provider. Do not drink  alcohol if your health care provider tells you not to drink. If you drink alcohol: Limit how much you have to 0-2 drinks a day. Know how much alcohol is in your drink. In the [place] one drink equals one 12 oz bottle of beer (355 mL), one 5 oz glass of wine (148 mL), or one 1 oz glass of hard liquor (44 mL). Lifestyle Brush your teeth every morning and night with fluoride toothpaste. Floss one time each day. Exercise for at least 30 minutes 5 or more days each week. Do not use any products that contain nicotine or tobacco. These products include cigarettes, chewing tobacco, and vaping devices, such as e-cigarettes. If you need help quitting, ask your health care provider. Do not use drugs. If you are sexually active, practice safe sex. Use a condom or other form of protection to prevent STIs. Take aspirin only as told by your health care provider. Make sure that you understand how much to take and what form to take. Work with your health care provider to find out whether it is safe and beneficial for you to take aspirin daily. Find healthy ways to manage stress, such as: Meditation, yoga, or listening to music. Journaling. Talking to a trusted person. Spending time with friends and family. Minimize exposure to UV radiation to reduce your risk of skin cancer. Safety Always wear your seat belt while driving or riding in a vehicle. Do not drive: If you have been drinking alcohol. Do not ride with someone who has been drinking. When you are tired or distracted. While texting. If you have been using any mind-altering substances or drugs. Wear a helmet and other protective equipment during sports activities. If you have firearms in your house, make sure you follow all gun safety procedures. What's next? Go to your health care provider once a year for an annual wellness visit. Ask your health care provider how often you should have your eyes and teeth checked. Stay up to date on all  vaccines. This information is not intended to replace advice given to you by your health care provider. Make sure you discuss any questions you have with your health care provider. Document Revised: 08/02/2020 Document Reviewed: 08/02/2020 Elsevier Patient Education  2024 [employer].

## 2023-03-03 NOTE — Progress Notes (Signed)
 Chief Complaint:  Allen Howe is a 63 y.o. male who presents today for his annual comprehensive physical exam.    Assessment/Plan:  Chronic Problems Addressed Today: T2DM (type 2 diabetes mellitus) (HCC) Check A1c.  Check A1c.  He is working on diet and exercise.  He is on metformin  100o mg twice daily.   Allergic rhinitis due to pollen Not fully controlled on Rhinocort  and Allegra.  Will add on Astelin .  Will refer to ENT if this continues to be an issue.  Hyperlipidemia associated with type 2 diabetes mellitus (HCC) Check lipids.  He is on Lipitor 20 mg daily.  Tolerating well.  OSA (obstructive sleep apnea) Stable on CPAP.  Gout Stable.  No recent flares.  On allopurinol  100 mg daily.  Check uric acid level today.  Hypertension associated with diabetes (HCC) Elevated today.  His home readings have been at goal per patient.  He is on losartan  100 mg daily.  He is not taking chlorthalidone .  We did discussed making medication adjustments today however he would like to hold off on this for now.  He will continue to work on diet and exercise.  Per patient he does have some element of whitecoat hypertension as well.  Will check labs today.  He will continue to monitor at home.  If still elevated at home would consider addition of chlorthalidone  or amlodipine .  Preventative Healthcare: Check labs.  Due for colon cancer screening in 2027-he would like to avoid colonoscopy may be interested in cologuard.  We can discuss this when he is due.  Will give shingles vaccine today.   Patient Counseling(The following topics were reviewed and/or handout was given):  -Nutrition: Stressed importance of moderation in sodium/caffeine intake, saturated fat and cholesterol, caloric balance, sufficient intake of fresh fruits, vegetables, and fiber.  -Stressed the importance of regular exercise.   -Substance Abuse: Discussed cessation/primary prevention of tobacco, alcohol, or other drug use; driving  or other dangerous activities under the influence; availability of treatment for abuse.   -Injury prevention: Discussed safety belts, safety helmets, smoke detector, smoking near bedding or upholstery.   -Sexuality: Discussed sexually transmitted diseases, partner selection, use of condoms, avoidance of unintended pregnancy and contraceptive alternatives.   -Dental health: Discussed importance of regular tooth brushing, flossing, and dental visits.  -Health maintenance and immunizations reviewed. Please refer to Health maintenance section.  Return to care in 1 year for next preventative visit.     Subjective:  HPI:  He has no acute complaints today. See Assessment / plan for status of chronic conditions.  Last seen over a year ago.  Doing well today.  No acute concerns.  Did injure his back about a month and a half ago.  Tripped at home at night while going to the bathroom.  Tripped over his dog.  Fell backwards into the bathroom door.  Had some pain to his upper back.  This is improving.  Also had more nasal congestion the last several weeks.  He has not had much improvement with over-the-counter medications.  Lifestyle Diet: Balanced. Cutting down on sweets and carbohydrates. Trying to get more fruits and vegetables.  Exercise: None currently due to recent injury.      03/03/2023    9:44 AM  Depression screen PHQ 2/9  Decreased Interest 0  Down, Depressed, Hopeless 0  PHQ - 2 Score 0    Health Maintenance Due  Topic Date Due   FOOT EXAM  12/17/2015   Diabetic kidney evaluation -  Urine ACR  02/21/2018   OPHTHALMOLOGY EXAM  09/15/2019   HEMOGLOBIN A1C  06/13/2022   Diabetic kidney evaluation - eGFR measurement  12/13/2022     ROS: Per HPI, otherwise a complete review of systems was negative.   PMH:  The following were reviewed and entered/updated in epic: Past Medical History:  Diagnosis Date   Alcohol abuse 04/23/2017   Allergic rhinitis due to pollen    Basal cell  carcinoma    Basal Cell on neck   Diabetes type 2, controlled (HCC)    Diverticulosis    Fatty liver    Gout    Hyperlipidemia LDL goal <70 01/06/2014   Hypertension    Hypogonadism male    Internal hemorrhoids    Kidney stones    Patient Active Problem List   Diagnosis Date Noted   OSA (obstructive sleep apnea) 12/12/2021   Anxiety 12/12/2021   Alcohol abuse 04/23/2017   Gastroesophageal reflux disease without esophagitis 05/23/2015   Hyperlipidemia associated with type 2 diabetes mellitus (HCC) 01/06/2014   Hypertension associated with diabetes (HCC)    Kidney stones    T2DM (type 2 diabetes mellitus) (HCC)    Basal cell carcinoma    Gout    Allergic rhinitis due to pollen    Hypogonadism male    Past Surgical History:  Procedure Laterality Date   VASECTOMY      Family History  Problem Relation Age of Onset   Hypertension Mother    Gallbladder disease Mother    Heart disease Father    Hypertension Father    Diabetes Father    Skin cancer Father    Colon cancer Neg Hx     Medications- reviewed and updated Current Outpatient Medications  Medication Sig Dispense Refill   allopurinol  (ZYLOPRIM ) 100 MG tablet TAKE 1 TABLET BY MOUTH DAILY 30 tablet 0   atorvastatin  (LIPITOR) 20 MG tablet TAKE 1 TABLET BY MOUTH DAILY 30 tablet 0   azelastine  (ASTELIN ) 0.1 % nasal spray Place 2 sprays into both nostrils 2 (two) times daily. 30 mL 12   colchicine  0.6 MG tablet Take 1 tablet (0.6 mg total) by mouth daily. Take two tablets at onset on symptoms for gout 30 tablet 11   losartan  (COZAAR ) 100 MG tablet TAKE 1 TABLET BY MOUTH DAILY 30 tablet 1   metFORMIN  (GLUCOPHAGE ) 1000 MG tablet TAKE 1 TABLET BY MOUTH TWICE A DAY WITH A MEAL 180 tablet 1   omeprazole  (PRILOSEC) 40 MG capsule TAKE ONE CAPSULE BY MOUTH DAILY 90 capsule 0   RHINOCORT  AQUA 32 MCG/ACT nasal spray PLACE 2 SPRAYS INTO BOTH NOSTRILS DAILY 8.43 mL 2   No current facility-administered medications for this visit.     Allergies-reviewed and updated No Known Allergies  Social History   Socioeconomic History   Marital status: Divorced    Spouse name: Not on file   Number of children: 2   Years of education: Not on file   Highest education level: Bachelor's degree (e.g., BA, AB, BS)  Occupational History   Occupation: accountant  Tobacco Use   Smoking status: Never   Smokeless tobacco: Former    Types: Chew    Quit date: 10/14/2005  Substance and Sexual Activity   Alcohol use: Yes    Alcohol/week: 14.0 standard drinks of alcohol    Types: 14 Standard drinks or equivalent per week    Comment: occ   Drug use: No   Sexual activity: Not Currently  Other Topics Concern  Not on file  Social History Narrative   Divorced   Dual custody of children   Social Drivers of Health   Financial Resource Strain: Low Risk  (03/02/2023)   Overall Financial Resource Strain (CARDIA)    Difficulty of Paying Living Expenses: Not hard at all  Food Insecurity: No Food Insecurity (03/02/2023)   Hunger Vital Sign    Worried About Running Out of Food in the Last Year: Never true    Ran Out of Food in the Last Year: Never true  Transportation Needs: No Transportation Needs (03/02/2023)   PRAPARE - Administrator, Civil Service (Medical): No    Lack of Transportation (Non-Medical): No  Physical Activity: Sufficiently Active (03/02/2023)   Exercise Vital Sign    Days of Exercise per Week: 4 days    Minutes of Exercise per Session: 50 min  Stress: No Stress Concern Present (03/02/2023)   [employer] of Occupational Health - Occupational Stress Questionnaire    Feeling of Stress : Only a little  Social Connections: Socially Isolated (03/02/2023)   Social Connection and Isolation Panel [NHANES]    Frequency of Communication with Friends and Family: More than three times a week    Frequency of Social Gatherings with Friends and Family: Once a week    Attends Religious Services: Never    Automotive Engineer or Organizations: No    Attends Engineer, Structural: Not on file    Marital Status: Divorced        Objective:  Physical Exam: BP (!) 150/98   Pulse (!) 122   Temp 99.4 F (37.4 C) (Oral)   Ht 5' 5 (1.651 m)   Wt 213 lb (96.6 kg)   SpO2 97%   BMI 35.45 kg/m   Body mass index is 35.45 kg/m. Wt Readings from Last 3 Encounters:  03/03/23 213 lb (96.6 kg)  12/12/21 220 lb 9.6 oz (100.1 kg)  11/12/17 216 lb 9.6 oz (98.2 kg)   Gen: NAD, resting comfortably HEENT: TMs normal bilaterally. OP clear. No thyromegaly noted.  CV: RRR with no murmurs appreciated Pulm: NWOB, CTAB with no crackles, wheezes, or rhonchi GI: Normal bowel sounds present. Soft, Nontender, Nondistended. MSK: no edema, cyanosis, or clubbing noted Skin: warm, dry Neuro: CN2-12 grossly intact. Strength 5/5 in upper and lower extremities. Reflexes symmetric and intact bilaterally.  Psych: Normal affect and thought content     Derrica Sieg M. Kennyth, MD 03/03/2023 10:23 AM

## 2023-03-03 NOTE — Assessment & Plan Note (Signed)
 Stable on CPAP

## 2023-03-03 NOTE — Assessment & Plan Note (Signed)
 Check A1c.  Check A1c.  He is working on diet and exercise.  He is on metformin 100o mg twice daily.

## 2023-03-03 NOTE — Assessment & Plan Note (Signed)
Check lipids. He is on Lipitor 20 mg daily. Tolerating well.

## 2023-03-05 ENCOUNTER — Other Ambulatory Visit: Payer: Self-pay | Admitting: *Deleted

## 2023-03-05 DIAGNOSIS — R7989 Other specified abnormal findings of blood chemistry: Secondary | ICD-10-CM

## 2023-03-05 DIAGNOSIS — E871 Hypo-osmolality and hyponatremia: Secondary | ICD-10-CM

## 2023-03-05 NOTE — Progress Notes (Signed)
 Blood count dropped a little bit since last year.  Probably nothing to worry about but I would like for him to come back soon to recheck to make sure this is stable.  Please place future order for CBC, iron panel, and B12.  His sodium is low.  This may be a side effect of his one of his medications.  Recommend that he come back to recheck c-Met with above labs.  Please place future order.  His blood sugar is better than last year.  A1c is now at goal.  He can continue taking metformin  1000 mg twice daily.  I would like to see him back in 6 months to recheck his A1c.  The rest of his labs are all at goal.  Do not need to make any other changes to treatment plan at this time.  We can recheck everything else in a year.

## 2023-03-06 ENCOUNTER — Other Ambulatory Visit (HOSPITAL_BASED_OUTPATIENT_CLINIC_OR_DEPARTMENT_OTHER): Payer: Self-pay

## 2023-03-16 ENCOUNTER — Other Ambulatory Visit: Payer: Self-pay | Admitting: Family Medicine

## 2023-04-03 ENCOUNTER — Other Ambulatory Visit (HOSPITAL_BASED_OUTPATIENT_CLINIC_OR_DEPARTMENT_OTHER): Payer: Self-pay

## 2023-04-03 ENCOUNTER — Other Ambulatory Visit: Payer: Self-pay | Admitting: Family Medicine

## 2023-04-03 ENCOUNTER — Other Ambulatory Visit: Payer: Self-pay

## 2023-04-03 MED ORDER — ALLOPURINOL 100 MG PO TABS
100.0000 mg | ORAL_TABLET | Freq: Every day | ORAL | 3 refills | Status: AC
  Filled 2023-04-03: qty 90, 90d supply, fill #0
  Filled 2023-07-01: qty 90, 90d supply, fill #1
  Filled 2023-10-02: qty 90, 90d supply, fill #2
  Filled 2024-01-25: qty 90, 90d supply, fill #3

## 2023-04-03 MED FILL — Losartan Potassium Tab 100 MG: ORAL | 30 days supply | Qty: 30 | Fill #0 | Status: AC

## 2023-04-03 MED FILL — Citalopram Hydrobromide Tab 20 MG (Base Equiv): ORAL | 90 days supply | Qty: 90 | Fill #0 | Status: CN

## 2023-04-03 MED FILL — Metformin HCl Tab 500 MG: ORAL | 90 days supply | Qty: 180 | Fill #0 | Status: CN

## 2023-04-03 MED FILL — Metformin HCl Tab 1000 MG: ORAL | 90 days supply | Qty: 90 | Fill #0 | Status: AC

## 2023-04-03 NOTE — Telephone Encounter (Signed)
He should be on metformin 1000mg  twice daily.  Katina Degree. Jimmey Ralph, MD 04/03/2023 12:43 PM

## 2023-04-03 NOTE — Telephone Encounter (Signed)
Patient requesting Rx Chlorthalidone  Please advise Metformin dose, Per pharmacy

## 2023-04-04 ENCOUNTER — Other Ambulatory Visit (HOSPITAL_BASED_OUTPATIENT_CLINIC_OR_DEPARTMENT_OTHER): Payer: Self-pay

## 2023-04-14 ENCOUNTER — Other Ambulatory Visit (HOSPITAL_BASED_OUTPATIENT_CLINIC_OR_DEPARTMENT_OTHER): Payer: Self-pay

## 2023-04-14 ENCOUNTER — Other Ambulatory Visit: Payer: Self-pay | Admitting: *Deleted

## 2023-04-14 ENCOUNTER — Other Ambulatory Visit: Payer: Self-pay | Admitting: Family Medicine

## 2023-04-14 MED ORDER — ATORVASTATIN CALCIUM 20 MG PO TABS
20.0000 mg | ORAL_TABLET | Freq: Every day | ORAL | 0 refills | Status: DC
  Filled 2023-04-14: qty 30, 30d supply, fill #0

## 2023-04-15 ENCOUNTER — Other Ambulatory Visit (HOSPITAL_BASED_OUTPATIENT_CLINIC_OR_DEPARTMENT_OTHER): Payer: Self-pay

## 2023-05-01 ENCOUNTER — Ambulatory Visit (INDEPENDENT_AMBULATORY_CARE_PROVIDER_SITE_OTHER): Payer: Commercial Managed Care - HMO

## 2023-05-01 DIAGNOSIS — Z23 Encounter for immunization: Secondary | ICD-10-CM

## 2023-05-01 NOTE — Progress Notes (Signed)
Patient was given the second shingles vaccine in the right arm today. Patient tolerated injection well. Ofilia Rayon S Lashannon Bresnan, CMA   

## 2023-05-05 ENCOUNTER — Other Ambulatory Visit: Payer: Self-pay | Admitting: Family Medicine

## 2023-05-05 ENCOUNTER — Other Ambulatory Visit (HOSPITAL_BASED_OUTPATIENT_CLINIC_OR_DEPARTMENT_OTHER): Payer: Self-pay

## 2023-05-05 MED ORDER — LOSARTAN POTASSIUM 100 MG PO TABS
100.0000 mg | ORAL_TABLET | Freq: Every day | ORAL | 1 refills | Status: DC
  Filled 2023-05-05: qty 30, 30d supply, fill #0

## 2023-05-05 MED FILL — Metformin HCl Tab 500 MG: ORAL | 90 days supply | Qty: 180 | Fill #0 | Status: AC

## 2023-05-08 ENCOUNTER — Other Ambulatory Visit: Payer: Self-pay | Admitting: Family Medicine

## 2023-05-08 ENCOUNTER — Other Ambulatory Visit (HOSPITAL_BASED_OUTPATIENT_CLINIC_OR_DEPARTMENT_OTHER): Payer: Self-pay

## 2023-05-08 MED ORDER — ATORVASTATIN CALCIUM 20 MG PO TABS
20.0000 mg | ORAL_TABLET | Freq: Every day | ORAL | 0 refills | Status: DC
  Filled 2023-05-08: qty 30, 30d supply, fill #0

## 2023-05-12 ENCOUNTER — Telehealth: Payer: Self-pay | Admitting: *Deleted

## 2023-05-12 ENCOUNTER — Other Ambulatory Visit: Payer: Self-pay | Admitting: *Deleted

## 2023-05-12 NOTE — Telephone Encounter (Signed)
 Spoke with patient, stated his mychart was block and thought it was the CMA blocking it Patient notified CMA do not have a way to do that, sytem was probably off  Patient stated Earleen Reaper is working now  Requesting medication refill

## 2023-05-13 ENCOUNTER — Other Ambulatory Visit (HOSPITAL_BASED_OUTPATIENT_CLINIC_OR_DEPARTMENT_OTHER): Payer: Self-pay

## 2023-05-13 ENCOUNTER — Other Ambulatory Visit: Payer: Self-pay | Admitting: *Deleted

## 2023-05-13 ENCOUNTER — Telehealth: Payer: Self-pay | Admitting: *Deleted

## 2023-05-13 MED ORDER — ATORVASTATIN CALCIUM 20 MG PO TABS
20.0000 mg | ORAL_TABLET | Freq: Every day | ORAL | 1 refills | Status: DC
  Filled 2023-05-13 – 2023-06-11 (×2): qty 90, 90d supply, fill #0
  Filled 2023-09-16 – 2023-10-02 (×2): qty 90, 90d supply, fill #1

## 2023-05-13 MED ORDER — LOSARTAN POTASSIUM 100 MG PO TABS
100.0000 mg | ORAL_TABLET | Freq: Every day | ORAL | 1 refills | Status: DC
  Filled 2023-05-13 – 2023-06-04 (×2): qty 90, 90d supply, fill #0
  Filled 2023-08-25: qty 90, 90d supply, fill #1

## 2023-05-13 NOTE — Telephone Encounter (Signed)
 Patient requesting Rx chlorthalidone (HYGROTON) 25 MG tablet #90 with 1 refill  Not in current medication list. Patient stated been taking medication  Please advise

## 2023-05-13 NOTE — Telephone Encounter (Signed)
 Please advise on refill for Chlorthalidone for patient

## 2023-05-13 NOTE — Telephone Encounter (Signed)
 Marland Kitchen

## 2023-05-13 NOTE — Telephone Encounter (Signed)
error 

## 2023-05-14 ENCOUNTER — Other Ambulatory Visit (HOSPITAL_BASED_OUTPATIENT_CLINIC_OR_DEPARTMENT_OTHER): Payer: Self-pay

## 2023-05-14 ENCOUNTER — Other Ambulatory Visit: Payer: Self-pay | Admitting: *Deleted

## 2023-05-14 MED ORDER — CHLORTHALIDONE 25 MG PO TABS
25.0000 mg | ORAL_TABLET | Freq: Every day | ORAL | 1 refills | Status: DC
  Filled 2023-05-14: qty 90, 90d supply, fill #0
  Filled 2023-08-17: qty 90, 90d supply, fill #1

## 2023-05-14 NOTE — Telephone Encounter (Signed)
 Ok to refill. Would like for him to monitor at home. He should bring all medication bottles with him to his next visit.  Katina Degree. Jimmey Ralph, MD 05/14/2023 8:53 AM

## 2023-05-14 NOTE — Telephone Encounter (Signed)
 Please see phone note. Make sure he follows up with [place] in a few weeks. He needs to come back for his repeat labs also.  Katina Degree. Jimmey Ralph, MD 05/14/2023 9:04 AM

## 2023-05-14 NOTE — Telephone Encounter (Signed)
 Spoke with patient, stated has not stop medication  Taking medication daily

## 2023-05-14 NOTE — Telephone Encounter (Signed)
 Rx send in Patient aware

## 2023-05-14 NOTE — Telephone Encounter (Signed)
 Did he just recently restart? He was off at our last visit.  Allen Howe. Jimmey Ralph, MD 05/14/2023 7:31 AM

## 2023-06-04 ENCOUNTER — Other Ambulatory Visit (HOSPITAL_BASED_OUTPATIENT_CLINIC_OR_DEPARTMENT_OTHER): Payer: Self-pay

## 2023-06-11 ENCOUNTER — Other Ambulatory Visit (HOSPITAL_BASED_OUTPATIENT_CLINIC_OR_DEPARTMENT_OTHER): Payer: Self-pay

## 2023-07-01 MED FILL — Metformin HCl Tab 1000 MG: ORAL | 90 days supply | Qty: 90 | Fill #1 | Status: AC

## 2023-08-25 ENCOUNTER — Other Ambulatory Visit (HOSPITAL_BASED_OUTPATIENT_CLINIC_OR_DEPARTMENT_OTHER): Payer: Self-pay

## 2023-09-16 ENCOUNTER — Other Ambulatory Visit (HOSPITAL_COMMUNITY): Payer: Self-pay

## 2023-09-27 ENCOUNTER — Other Ambulatory Visit (HOSPITAL_BASED_OUTPATIENT_CLINIC_OR_DEPARTMENT_OTHER): Payer: Self-pay

## 2023-10-02 ENCOUNTER — Other Ambulatory Visit (HOSPITAL_BASED_OUTPATIENT_CLINIC_OR_DEPARTMENT_OTHER): Payer: Self-pay

## 2023-10-02 ENCOUNTER — Other Ambulatory Visit: Payer: Self-pay | Admitting: Family Medicine

## 2023-10-02 ENCOUNTER — Other Ambulatory Visit: Payer: Self-pay

## 2023-10-02 MED ORDER — METFORMIN HCL 1000 MG PO TABS
ORAL_TABLET | ORAL | 0 refills | Status: DC
  Filled 2023-10-02: qty 180, 90d supply, fill #0

## 2023-10-02 MED ORDER — CHLORTHALIDONE 25 MG PO TABS
25.0000 mg | ORAL_TABLET | Freq: Every day | ORAL | 1 refills | Status: AC
  Filled 2023-10-02 – 2023-11-16 (×2): qty 90, 90d supply, fill #0
  Filled 2024-02-15: qty 90, 90d supply, fill #1

## 2023-10-02 MED ORDER — LOSARTAN POTASSIUM 100 MG PO TABS
100.0000 mg | ORAL_TABLET | Freq: Every day | ORAL | 1 refills | Status: AC
  Filled 2023-10-02 – 2023-11-29 (×2): qty 90, 90d supply, fill #0
  Filled 2024-02-27: qty 90, 90d supply, fill #1

## 2023-10-03 ENCOUNTER — Other Ambulatory Visit (HOSPITAL_BASED_OUTPATIENT_CLINIC_OR_DEPARTMENT_OTHER): Payer: Self-pay

## 2023-11-16 ENCOUNTER — Other Ambulatory Visit: Payer: Self-pay

## 2023-11-16 ENCOUNTER — Other Ambulatory Visit (HOSPITAL_BASED_OUTPATIENT_CLINIC_OR_DEPARTMENT_OTHER): Payer: Self-pay

## 2023-11-29 ENCOUNTER — Other Ambulatory Visit (HOSPITAL_BASED_OUTPATIENT_CLINIC_OR_DEPARTMENT_OTHER): Payer: Self-pay

## 2023-12-08 ENCOUNTER — Other Ambulatory Visit: Payer: Self-pay

## 2023-12-25 ENCOUNTER — Other Ambulatory Visit: Payer: Self-pay | Admitting: Family Medicine

## 2023-12-25 ENCOUNTER — Other Ambulatory Visit (HOSPITAL_BASED_OUTPATIENT_CLINIC_OR_DEPARTMENT_OTHER): Payer: Self-pay

## 2023-12-25 MED ORDER — METFORMIN HCL 1000 MG PO TABS
ORAL_TABLET | ORAL | 0 refills | Status: AC
  Filled 2023-12-25: qty 90, 90d supply, fill #0
  Filled 2024-03-07 – 2024-03-15 (×2): qty 90, 90d supply, fill #1

## 2023-12-25 MED ORDER — ATORVASTATIN CALCIUM 20 MG PO TABS
20.0000 mg | ORAL_TABLET | Freq: Every day | ORAL | 1 refills | Status: AC
  Filled 2023-12-25: qty 90, 90d supply, fill #0

## 2023-12-26 ENCOUNTER — Other Ambulatory Visit: Payer: Self-pay

## 2024-01-26 ENCOUNTER — Other Ambulatory Visit: Payer: Self-pay

## 2024-03-04 ENCOUNTER — Ambulatory Visit: Payer: Commercial Managed Care - HMO | Admitting: Family Medicine

## 2024-03-04 ENCOUNTER — Other Ambulatory Visit (HOSPITAL_BASED_OUTPATIENT_CLINIC_OR_DEPARTMENT_OTHER): Payer: Self-pay

## 2024-03-04 ENCOUNTER — Encounter: Payer: Self-pay | Admitting: Family Medicine

## 2024-03-04 VITALS — BP 138/80 | HR 112 | Temp 97.9°F | Ht 65.0 in | Wt 219.0 lb

## 2024-03-04 DIAGNOSIS — Z0001 Encounter for general adult medical examination with abnormal findings: Secondary | ICD-10-CM

## 2024-03-04 DIAGNOSIS — E785 Hyperlipidemia, unspecified: Secondary | ICD-10-CM

## 2024-03-04 DIAGNOSIS — Z23 Encounter for immunization: Secondary | ICD-10-CM | POA: Diagnosis not present

## 2024-03-04 DIAGNOSIS — G8929 Other chronic pain: Secondary | ICD-10-CM

## 2024-03-04 DIAGNOSIS — I152 Hypertension secondary to endocrine disorders: Secondary | ICD-10-CM

## 2024-03-04 DIAGNOSIS — Z125 Encounter for screening for malignant neoplasm of prostate: Secondary | ICD-10-CM

## 2024-03-04 DIAGNOSIS — M545 Low back pain, unspecified: Secondary | ICD-10-CM

## 2024-03-04 DIAGNOSIS — Z7984 Long term (current) use of oral hypoglycemic drugs: Secondary | ICD-10-CM | POA: Diagnosis not present

## 2024-03-04 DIAGNOSIS — E1159 Type 2 diabetes mellitus with other circulatory complications: Secondary | ICD-10-CM

## 2024-03-04 DIAGNOSIS — G4733 Obstructive sleep apnea (adult) (pediatric): Secondary | ICD-10-CM | POA: Diagnosis not present

## 2024-03-04 DIAGNOSIS — M109 Gout, unspecified: Secondary | ICD-10-CM | POA: Diagnosis not present

## 2024-03-04 DIAGNOSIS — E1169 Type 2 diabetes mellitus with other specified complication: Secondary | ICD-10-CM | POA: Diagnosis not present

## 2024-03-04 DIAGNOSIS — E1165 Type 2 diabetes mellitus with hyperglycemia: Secondary | ICD-10-CM

## 2024-03-04 LAB — CBC
HCT: 34.6 % — ABNORMAL LOW (ref 39.0–52.0)
Hemoglobin: 12.1 g/dL — ABNORMAL LOW (ref 13.0–17.0)
MCHC: 34.9 g/dL (ref 30.0–36.0)
MCV: 97.6 fl (ref 78.0–100.0)
Platelets: 193 K/uL (ref 150.0–400.0)
RBC: 3.54 Mil/uL — ABNORMAL LOW (ref 4.22–5.81)
RDW: 13.5 % (ref 11.5–15.5)
WBC: 5.9 K/uL (ref 4.0–10.5)

## 2024-03-04 LAB — COMPREHENSIVE METABOLIC PANEL WITH GFR
ALT: 55 U/L — ABNORMAL HIGH (ref 3–53)
AST: 44 U/L — ABNORMAL HIGH (ref 5–37)
Albumin: 4.5 g/dL (ref 3.5–5.2)
Alkaline Phosphatase: 57 U/L (ref 39–117)
BUN: 13 mg/dL (ref 6–23)
CO2: 29 meq/L (ref 19–32)
Calcium: 9.8 mg/dL (ref 8.4–10.5)
Chloride: 92 meq/L — ABNORMAL LOW (ref 96–112)
Creatinine, Ser: 0.94 mg/dL (ref 0.40–1.50)
GFR: 85.45 mL/min
Glucose, Bld: 189 mg/dL — ABNORMAL HIGH (ref 70–99)
Potassium: 3.4 meq/L — ABNORMAL LOW (ref 3.5–5.1)
Sodium: 131 meq/L — ABNORMAL LOW (ref 135–145)
Total Bilirubin: 0.6 mg/dL (ref 0.2–1.2)
Total Protein: 7.1 g/dL (ref 6.0–8.3)

## 2024-03-04 LAB — LIPID PANEL
Cholesterol: 141 mg/dL (ref 28–200)
HDL: 75 mg/dL
LDL Cholesterol: 33 mg/dL (ref 10–99)
NonHDL: 66.2
Total CHOL/HDL Ratio: 2
Triglycerides: 167 mg/dL — ABNORMAL HIGH (ref 10.0–149.0)
VLDL: 33.4 mg/dL (ref 0.0–40.0)

## 2024-03-04 LAB — URIC ACID: Uric Acid, Serum: 4.7 mg/dL (ref 4.0–7.8)

## 2024-03-04 LAB — PSA: PSA: 1.44 ng/mL (ref 0.10–4.00)

## 2024-03-04 LAB — TSH: TSH: 2.98 u[IU]/mL (ref 0.35–5.50)

## 2024-03-04 LAB — HEMOGLOBIN A1C: Hgb A1c MFr Bld: 6.6 % — ABNORMAL HIGH (ref 4.6–6.5)

## 2024-03-04 MED ORDER — CYCLOBENZAPRINE HCL 10 MG PO TABS
10.0000 mg | ORAL_TABLET | Freq: Three times a day (TID) | ORAL | 0 refills | Status: AC | PRN
  Filled 2024-03-04: qty 30, 10d supply, fill #0

## 2024-03-04 MED ORDER — MELOXICAM 15 MG PO TABS
15.0000 mg | ORAL_TABLET | Freq: Every day | ORAL | 0 refills | Status: AC
  Filled 2024-03-04: qty 30, 30d supply, fill #0

## 2024-03-04 NOTE — Assessment & Plan Note (Signed)
 Check A1c.  He is working on diet and exercise.  On metformin  1000 mg twice daily.

## 2024-03-04 NOTE — Assessment & Plan Note (Signed)
 Stable.  No recent flares.  On allopurinol 100 mg daily.  Check uric acid level today.

## 2024-03-04 NOTE — Progress Notes (Signed)
 "  Chief Complaint:  Allen Howe is a 64 y.o. male who presents today for his annual comprehensive physical exam.    Assessment/Plan:  Chronic Problems Addressed Today: T2DM (type 2 diabetes mellitus) (HCC) Check A1c.  He is working on diet and exercise.  On metformin  1000 mg twice daily.  Gout Stable.  No recent flares.  On allopurinol  100 mg daily.  Check uric acid level today.  Hyperlipidemia associated with type 2 diabetes mellitus (HCC) Check lipids.  Doing well with Lipitor 20 mg daily.  OSA (obstructive sleep apnea) Continue CPAP nightly.  Chronic low back pain Patient with flareup of chronic low back pain over the last several weeks.  We did discuss imaging today however he declined.  We discussed conservative measures including home exercise program.  Will also start meloxicam  and Flexeril .  We did discuss referral to PT or sports medicine however he declined.  He will let us  know if not improving in the next couple weeks and would consider imaging versus referral at that time.  We discussed reasons to return to care.  Hypertension associated with diabetes (HCC) Initially elevated today, but at goal on recheck.  He will continue losartan  100 mg and chlorthalidone  25 mg daily daily.  He will monitor at home and let us  know if persistently elevated.  Preventative Healthcare: Prevnar 20 given today.  Check labs.  Due for colonoscopy next year though he is not sure if he will pursue this any further.  We can discuss again next year.  Patient Counseling(The following topics were reviewed and/or handout was given):  -Nutrition: Stressed importance of moderation in sodium/caffeine intake, saturated fat and cholesterol, caloric balance, sufficient intake of fresh fruits, vegetables, and fiber.  -Stressed the importance of regular exercise.   -Substance Abuse: Discussed cessation/primary prevention of tobacco, alcohol, or other drug use; driving or other dangerous activities under  the influence; availability of treatment for abuse.   -Injury prevention: Discussed safety belts, safety helmets, smoke detector, smoking near bedding or upholstery.   -Sexuality: Discussed sexually transmitted diseases, partner selection, use of condoms, avoidance of unintended pregnancy and contraceptive alternatives.   -Dental health: Discussed importance of regular tooth brushing, flossing, and dental visits.  -Health maintenance and immunizations reviewed. Please refer to Health maintenance section.  Return to care in 1 year for next preventative visit.     Subjective:  HPI:  He has no acute complaints today. Patient is here today for his annual physical.  See assessment / plan for status of chronic conditions.  Discussed the use of AI scribe software for clinical note transcription with the patient, who gave verbal consent to proceed.  History of Present Illness Allen Howe is a 64 year old male who presents for an annual physical exam.  He has a history of a back injury sustained 44 years ago while downhill skiing, resulting in a crushed L3 vertebra. Over the years, he has experienced intermittent back pain, which he manages by recognizing his physical limitations. In October, while lifting weights at home, he felt a sudden pain in his back, which has persisted since then. The pain originates in the lower back and radiates to his legs, primarily on the right side. He has been taking 800 mg of Tylenol three times a day to manage the pain, especially at night, and uses a heating pad for relief.  He has resumed exercising in the water, including walking and light swimming, which he finds beneficial. Activities like cycling and gym  workouts help alleviate his symptoms, but he is currently unable to perform them due to the pain. He reports that using ibuprofen, heating pads, and engaging in physical activity can help him feel better.  He has a history of arthritis, which affects  his hands, and he has broken almost every finger in the past. He is cautious about taking too much Tylenol due to potential health risks.  He follows a healthy diet, typically having a slice of tomato, half an avocado, and a fried egg on whole grain toast for breakfast, and he avoids high sodium foods, sweets, and carbs. He is transitioning to [facility] and has chosen a [facility]. 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  No family history of colon cancer.        03/04/2024    9:11 AM  Depression screen PHQ 2/9  Decreased Interest 0  Down, Depressed, Hopeless 0  PHQ - 2 Score 0    Health Maintenance Due  Topic Date Due   HIV Screening  Never done   FOOT EXAM  12/17/2015   Diabetic kidney evaluation - Urine ACR  12/26/2016   OPHTHALMOLOGY EXAM  09/15/2019   Zoster Vaccines- Shingrix  (2 of 2) 06/26/2023   HEMOGLOBIN A1C  08/31/2023   Diabetic kidney evaluation - eGFR measurement  03/02/2024     ROS: Per HPI, otherwise a complete review of systems was negative.   PMH:  The following were reviewed and entered/updated in epic: Past Medical History:  Diagnosis Date   Alcohol abuse 04/23/2017   Allergic rhinitis due to pollen    Basal cell carcinoma    Basal Cell on neck   Diabetes type 2, controlled (HCC)    Diverticulosis    Fatty liver    Gout    Hyperlipidemia LDL goal <70 01/06/2014   Hypertension    Hypogonadism male    Internal hemorrhoids    Kidney stones    Patient Active Problem List   Diagnosis Date Noted   Chronic low back pain 03/04/2024   OSA (obstructive sleep apnea) 12/12/2021   Anxiety 12/12/2021   Alcohol abuse 04/23/2017   Gastroesophageal reflux disease without esophagitis 05/23/2015   Hyperlipidemia associated with type 2 diabetes mellitus (HCC) 01/06/2014   Hypertension associated with diabetes (HCC)    Kidney stones    T2DM (type 2 diabetes mellitus) (HCC)    Basal cell carcinoma    Gout     Allergic rhinitis due to pollen    Hypogonadism male    Past Surgical History:  Procedure Laterality Date   VASECTOMY      Family History  Problem Relation Age of Onset   Hypertension Mother    Gallbladder disease Mother    Heart disease Father    Hypertension Father    Diabetes Father    Skin cancer Father    Colon cancer Neg Hx     Medications- reviewed and updated Current Outpatient Medications  Medication Sig Dispense Refill   allopurinol  (ZYLOPRIM ) 100 MG tablet Take 1 tablet (100 mg total) by mouth daily. 90 tablet 3   atorvastatin  (LIPITOR) 20 MG tablet Take 1 tablet (20 mg total) by mouth daily. 90 tablet 1   azelastine  (ASTELIN ) 0.1 % nasal spray Place 2 sprays into both nostrils 2 (two) times daily. 30 mL 12   chlorthalidone  (HYGROTON ) 25 MG tablet Take 1 tablet (25 mg total) by mouth daily. 90 tablet 1   colchicine  0.6 MG  tablet Take 1 tablet (0.6 mg total) by mouth daily. Take two tablets at onset on symptoms for gout 30 tablet 11   cyclobenzaprine  (FLEXERIL ) 10 MG tablet Take 1 tablet (10 mg total) by mouth 3 (three) times daily as needed for muscle spasms. 30 tablet 0   losartan  (COZAAR ) 100 MG tablet Take 1 tablet (100 mg total) by mouth daily. 90 tablet 1   meloxicam  (MOBIC ) 15 MG tablet Take 1 tablet (15 mg total) by mouth daily. 30 tablet 0   metFORMIN  (GLUCOPHAGE ) 1000 MG tablet Take 1 tablet (1,000 mg total) by mouth daily with breakfast. 180 tablet 0   No current facility-administered medications for this visit.    Allergies-reviewed and updated Allergies[1]  Social History   Socioeconomic History   Marital status: Divorced    Spouse name: Not on file   Number of children: 2   Years of education: Not on file   Highest education level: Bachelor's degree (e.g., BA, AB, BS)  Occupational History   Occupation: accountant  Tobacco Use   Smoking status: Never   Smokeless tobacco: Former    Types: Chew    Quit date: 10/14/2005  Substance and Sexual  Activity   Alcohol use: Yes    Alcohol/week: 14.0 standard drinks of alcohol    Types: 14 Standard drinks or equivalent per week    Comment: occ   Drug use: No   Sexual activity: Not Currently  Other Topics Concern   Not on file  Social History Narrative   Divorced   Dual custody of children   Social Drivers of Health   Tobacco Use: Medium Risk (03/04/2024)   Patient History    Smoking Tobacco Use: Never    Smokeless Tobacco Use: Former    Passive Exposure: Not on Actuary Strain: Low Risk (03/01/2024)   Overall Financial Resource Strain (CARDIA)    Difficulty of Paying Living Expenses: Not hard at all  Food Insecurity: Unknown (03/01/2024)   Epic    Worried About Programme Researcher, Broadcasting/film/video in the Last Year: Never true    Ran Out of Food in the Last Year: Patient declined  Transportation Needs: No Transportation Needs (03/01/2024)   Epic    Lack of Transportation (Medical): No    Lack of Transportation (Non-Medical): No  Physical Activity: Insufficiently Active (03/01/2024)   Exercise Vital Sign    Days of Exercise per Week: 2 days    Minutes of Exercise per Session: 30 min  Stress: No Stress Concern Present (03/01/2024)   [employer] of Occupational Health - Occupational Stress Questionnaire    Feeling of Stress: Only a little  Social Connections: Moderately Isolated (03/01/2024)   Social Connection and Isolation Panel    Frequency of Communication with Friends and Family: More than three times a week    Frequency of Social Gatherings with Friends and Family: Once a week    Attends Religious Services: Patient declined    Active Member of Clubs or Organizations: Yes    Attends Banker Meetings: More than 4 times per year    Marital Status: Divorced  Depression (PHQ2-9): Low Risk (03/04/2024)   Depression (PHQ2-9)    PHQ-2 Score: 0  Alcohol Screen: Low Risk (03/01/2024)   Alcohol Screen    Last Alcohol Screening Score (AUDIT): 7  Housing: Unknown  (03/01/2024)   Epic    Unable to Pay for Housing in the Last Year: No    Number of Times Moved in the  Last Year: Not on file    Homeless in the Last Year: No  Utilities: Not on file  Health Literacy: Not on file        Objective:  Physical Exam: BP 138/80   Pulse (!) 112   Temp 97.9 F (36.6 C) (Temporal)   Ht 5' 5 (1.651 m)   Wt 219 lb (99.3 kg)   SpO2 97%   BMI 36.44 kg/m   Body mass index is 36.44 kg/m. Wt Readings from Last 3 Encounters:  03/04/24 219 lb (99.3 kg)  03/03/23 213 lb (96.6 kg)  12/12/21 220 lb 9.6 oz (100.1 kg)   Gen: NAD, resting comfortably HEENT: TMs normal bilaterally. OP clear. No thyromegaly noted.  CV: RRR with no murmurs appreciated Pulm: NWOB, CTAB with no crackles, wheezes, or rhonchi GI: Normal bowel sounds present. Soft, Nontender, Nondistended. MSK: no edema, cyanosis, or clubbing noted - Back: No deformities.  Tenderness palpation along bilateral lower lumbar paraspinal muscle groups.  Neurovascular intact distally. Skin: warm, dry Neuro: CN2-12 grossly intact. Strength 5/5 in upper and lower extremities. Reflexes symmetric and intact bilaterally.  Psych: Normal affect and thought content     Allen Howe M. Kennyth, MD 03/04/2024 10:17 AM     [1] No Known Allergies  "

## 2024-03-04 NOTE — Patient Instructions (Signed)
 It was very nice to see you today!  VISIT SUMMARY: Today, you had your annual physical exam. We discussed your chronic low back pain, obstructive sleep apnea, and general health maintenance.  YOUR PLAN: CHRONIC LOW BACK PAIN: You have been experiencing persistent lower back pain that radiates to your right leg, likely due to muscle spasm or degenerative changes. -Continue taking Tylenol and using a heating pad for relief. -Start the prescribed anti-inflammatory medication for two weeks and a muscle relaxer as needed. -Engage in aquatic exercises and stretching as advised. -Follow the back stretches provided in the handout.  OBSTRUCTIVE SLEEP APNEA: Your obstructive sleep apnea is well-managed with CPAP therapy. -Continue using your CPAP therapy as it is helping you get adequate sleep.  GENERAL HEALTH MAINTENANCE: We discussed your Medicare wellness visit and insurance, and administered a pneumonia vaccine. -Blood work is planned. -Colonoscopy is not pursued due to personal preference and risk concerns; alternative screening options were discussed.  Return in about 1 year (around 03/04/2025) for Annual Physical.   Take care, Dr Kennyth  PLEASE NOTE:  If you had any lab tests, please let us  know if you have not heard back within a few days. You may see your results on mychart before we have a chance to review them but we will give you a call once they are reviewed by us .   If we ordered any referrals today, please let us  know if you have not heard from their office within the next week.   If you had any urgent prescriptions sent in today, please check with the pharmacy within an hour of our visit to make sure the prescription was transmitted appropriately.   Please try these tips to maintain a healthy lifestyle:  Eat at least 3 REAL meals and 1-2 snacks per day.  Aim for no more than 5 hours between eating.  If you eat breakfast, please do so within one hour of getting up.   Each meal  should contain half fruits/vegetables, one quarter protein, and one quarter carbs (no bigger than a computer mouse)  Cut down on sweet beverages. This includes juice, soda, and sweet tea.   Drink at least 1 glass of water with each meal and aim for at least 8 glasses per day  Exercise at least 150 minutes every week.    Preventive Care 80-74 Years Old, Male Preventive care refers to lifestyle choices and visits with your health care provider that can promote health and wellness. Preventive care visits are also called wellness exams. What can I expect for my preventive care visit? Counseling During your preventive care visit, your health care provider may ask about your: Medical history, including: Past medical problems. Family medical history. Current health, including: Emotional well-being. Home life and relationship well-being. Sexual activity. Lifestyle, including: Alcohol, nicotine or tobacco, and drug use. Access to firearms. Diet, exercise, and sleep habits. Safety issues such as seatbelt and bike helmet use. Sunscreen use. Work and work astronomer. Physical exam Your health care provider will check your: Height and weight. These may be used to calculate your BMI (body mass index). BMI is a measurement that tells if you are at a healthy weight. Waist circumference. This measures the distance around your waistline. This measurement also tells if you are at a healthy weight and may help predict your risk of certain diseases, such as type 2 diabetes and high blood pressure. Heart rate and blood pressure. Body temperature. Skin for abnormal spots. What immunizations do I need?  Vaccines are usually given at various ages, according to a schedule. Your health care provider will recommend vaccines for you based on your age, medical history, and lifestyle or other factors, such as travel or where you work. What tests do I need? Screening Your health care provider may recommend  screening tests for certain conditions. This may include: Lipid and cholesterol levels. Diabetes screening. This is done by checking your blood sugar (glucose) after you have not eaten for a while (fasting). Hepatitis B test. Hepatitis C test. HIV (human immunodeficiency virus) test. STI (sexually transmitted infection) testing, if you are at risk. Lung cancer screening. Prostate cancer screening. Colorectal cancer screening. Talk with your health care provider about your test results, treatment options, and if necessary, the need for more tests. Follow these instructions at home: Eating and drinking  Eat a diet that includes fresh fruits and vegetables, whole grains, lean protein, and low-fat dairy products. Take vitamin and mineral supplements as recommended by your health care provider. Do not drink alcohol if your health care provider tells you not to drink. If you drink alcohol: Limit how much you have to 0-2 drinks a day. Know how much alcohol is in your drink. In the [place] one drink equals one 12 oz bottle of beer (355 mL), one 5 oz glass of wine (148 mL), or one 1 oz glass of hard liquor (44 mL). Lifestyle Brush your teeth every morning and night with fluoride toothpaste. Floss one time each day. Exercise for at least 30 minutes 5 or more days each week. Do not use any products that contain nicotine or tobacco. These products include cigarettes, chewing tobacco, and vaping devices, such as e-cigarettes. If you need help quitting, ask your health care provider. Do not use drugs. If you are sexually active, practice safe sex. Use a condom or other form of protection to prevent STIs. Take aspirin only as told by your health care provider. Make sure that you understand how much to take and what form to take. Work with your health care provider to find out whether it is safe and beneficial for you to take aspirin daily. Find healthy ways to manage stress, such as: Meditation, yoga, or  listening to music. Journaling. Talking to a trusted person. Spending time with friends and family. Minimize exposure to UV radiation to reduce your risk of skin cancer. Safety Always wear your seat belt while driving or riding in a vehicle. Do not drive: If you have been drinking alcohol. Do not ride with someone who has been drinking. When you are tired or distracted. While texting. If you have been using any mind-altering substances or drugs. Wear a helmet and other protective equipment during sports activities. If you have firearms in your house, make sure you follow all gun safety procedures. What's next? Go to your health care provider once a year for an annual wellness visit. Ask your health care provider how often you should have your eyes and teeth checked. Stay up to date on all vaccines. This information is not intended to replace advice given to you by your health care provider. Make sure you discuss any questions you have with your health care provider. Document Revised: 08/02/2020 Document Reviewed: 08/02/2020 Elsevier Patient Education  2024 [employer].

## 2024-03-04 NOTE — Assessment & Plan Note (Signed)
"  Continue CPAP nightly         "

## 2024-03-04 NOTE — Assessment & Plan Note (Addendum)
 Initially elevated today, but at goal on recheck.  He will continue losartan  100 mg and chlorthalidone  25 mg daily daily.  He will monitor at home and let us  know if persistently elevated.

## 2024-03-04 NOTE — Assessment & Plan Note (Signed)
 Patient with flareup of chronic low back pain over the last several weeks.  We did discuss imaging today however he declined.  We discussed conservative measures including home exercise program.  Will also start meloxicam  and Flexeril .  We did discuss referral to PT or sports medicine however he declined.  He will let us  know if not improving in the next couple weeks and would consider imaging versus referral at that time.  We discussed reasons to return to care.

## 2024-03-04 NOTE — Assessment & Plan Note (Signed)
 Check lipids.  Doing well with Lipitor 20 mg daily.

## 2024-03-05 ENCOUNTER — Ambulatory Visit: Payer: Self-pay | Admitting: Family Medicine

## 2024-03-05 DIAGNOSIS — D509 Iron deficiency anemia, unspecified: Secondary | ICD-10-CM

## 2024-03-05 DIAGNOSIS — E871 Hypo-osmolality and hyponatremia: Secondary | ICD-10-CM

## 2024-03-05 DIAGNOSIS — R748 Abnormal levels of other serum enzymes: Secondary | ICD-10-CM

## 2024-03-05 NOTE — Progress Notes (Signed)
 A1c is stable at 6.6.  He can continue the metformin  we can recheck this again in 6 months or so.  His sodium was a little bit low but similar to last few values.  He also had slight increased liver numbers and decreased blood count.  The increase in liver numbers is probably due to fatty liver though we should probably recheck this again in a few weeks.  Please place future order for CMET.  We need to figure out why he is mildly anemic.  Please add on iron panel, B12, and folate if possible.  If not we can have him come back here to recheck.  Please place future orders.  All of his other labs are at goal and we can recheck in a year.

## 2024-03-07 ENCOUNTER — Other Ambulatory Visit: Payer: Self-pay | Admitting: Family Medicine

## 2024-03-07 ENCOUNTER — Other Ambulatory Visit (HOSPITAL_BASED_OUTPATIENT_CLINIC_OR_DEPARTMENT_OTHER): Payer: Self-pay

## 2024-03-08 ENCOUNTER — Other Ambulatory Visit (HOSPITAL_BASED_OUTPATIENT_CLINIC_OR_DEPARTMENT_OTHER): Payer: Self-pay

## 2024-03-08 MED ORDER — AZELASTINE HCL 0.1 % NA SOLN
2.0000 | Freq: Two times a day (BID) | NASAL | 12 refills | Status: AC
  Filled 2024-03-08: qty 30, 50d supply, fill #0

## 2024-03-08 NOTE — Progress Notes (Signed)
 Due to insurance issues pt states they will come for the labs the time of his physical next year so he is not paying out of pocket costs. He mentioned their would a change in insurance as well, he said he disclosed this at his last physical on 03/04/24. aw

## 2024-03-16 ENCOUNTER — Other Ambulatory Visit (HOSPITAL_BASED_OUTPATIENT_CLINIC_OR_DEPARTMENT_OTHER): Payer: Self-pay

## 2025-03-10 ENCOUNTER — Encounter: Admitting: Family Medicine
